# Patient Record
Sex: Female | Born: 1980 | Hispanic: No | Marital: Married | Smoking: Never smoker
Health system: Southern US, Community
[De-identification: ages and names within clinical notes are randomized; demographics above are authoritative.]

## PROBLEM LIST (undated history)

## (undated) DIAGNOSIS — D649 Anemia, unspecified: Secondary | ICD-10-CM

## (undated) HISTORY — DX: Anemia, unspecified: D64.9

---

## 2010-09-26 ENCOUNTER — Inpatient Hospital Stay (HOSPITAL_COMMUNITY): Admission: AD | Admit: 2010-09-26 | Payer: Self-pay | Admitting: Obstetrics

## 2011-01-25 ENCOUNTER — Institutional Professional Consult (permissible substitution): Payer: BC Managed Care – PPO | Admitting: Pediatrics

## 2011-02-04 ENCOUNTER — Inpatient Hospital Stay (HOSPITAL_COMMUNITY)
Admission: AD | Admit: 2011-02-04 | Discharge: 2011-02-06 | DRG: 373 | Disposition: A | Discharge status: Home / Self Care | Payer: BC Managed Care – PPO | Source: Ambulatory Visit | Attending: Obstetrics | Admitting: Obstetrics

## 2011-02-04 LAB — CBC
Hemoglobin: 11.5 g/dL — ABNORMAL LOW (ref 12.0–15.0)
Platelets: 154 10*3/uL (ref 150–400)
RBC: 4.04 MIL/uL (ref 3.87–5.11)
WBC: 11.6 10*3/uL — ABNORMAL HIGH (ref 4.0–10.5)

## 2011-02-04 LAB — RPR: RPR Ser Ql: NONREACTIVE

## 2011-02-04 LAB — ABO/RH: ABO/RH(D): A POS

## 2011-02-04 LAB — TYPE AND SCREEN
ABO/RH(D): A POS
Antibody Screen: NEGATIVE

## 2011-02-05 LAB — CBC
Hemoglobin: 10.1 g/dL — ABNORMAL LOW (ref 12.0–15.0)
MCH: 28 pg (ref 26.0–34.0)
MCHC: 32.2 g/dL (ref 30.0–36.0)
RDW: 14.5 % (ref 11.5–15.5)

## 2015-09-28 NOTE — L&D Delivery Note (Signed)
Delivery Note At 9.44 am a viable and healthy female was delivered via  (Presentation: OA  ).  APGAR: 8 and 9 ; weight -pending   Placenta status:spontaneous, complete.   Cord: loose nuchal cord, reduced at head delivery.  Anesthesia:  Nitrous oxide inhalation and 1% plain Xylocaine infiltration at perineum  Episiotomy:  None  Lacerations:  Right peri-urethral laceration Suture Repair: 3.0 vicryl rapide Est. Blood Loss (mL):  250 cc  Mom to postpartum.  Baby to Couplet care / Skin to Skin.  Shemekia Patane R 04/26/2016, 10:24 AM

## 2015-09-30 LAB — OB RESULTS CONSOLE GC/CHLAMYDIA
CHLAMYDIA, DNA PROBE: NEGATIVE
GC PROBE AMP, GENITAL: NEGATIVE

## 2015-09-30 LAB — OB RESULTS CONSOLE HEPATITIS B SURFACE ANTIGEN: Hepatitis B Surface Ag: NEGATIVE

## 2015-09-30 LAB — OB RESULTS CONSOLE ABO/RH: RH Type: POSITIVE

## 2015-09-30 LAB — OB RESULTS CONSOLE RUBELLA ANTIBODY, IGM: Rubella: IMMUNE

## 2015-09-30 LAB — OB RESULTS CONSOLE HIV ANTIBODY (ROUTINE TESTING): HIV: NONREACTIVE

## 2015-09-30 LAB — OB RESULTS CONSOLE RPR: RPR: NONREACTIVE

## 2015-09-30 LAB — OB RESULTS CONSOLE ANTIBODY SCREEN: Antibody Screen: NEGATIVE

## 2016-03-31 LAB — OB RESULTS CONSOLE GBS: STREP GROUP B AG: NEGATIVE

## 2016-04-22 ENCOUNTER — Telehealth (HOSPITAL_COMMUNITY): Payer: Self-pay | Admitting: *Deleted

## 2016-04-22 ENCOUNTER — Encounter (HOSPITAL_COMMUNITY): Payer: Self-pay | Admitting: *Deleted

## 2016-04-22 NOTE — Telephone Encounter (Signed)
Preadmission screen  

## 2016-04-26 ENCOUNTER — Encounter (HOSPITAL_COMMUNITY): Payer: Self-pay | Admitting: *Deleted

## 2016-04-26 ENCOUNTER — Inpatient Hospital Stay (HOSPITAL_COMMUNITY)
Admission: AD | Admit: 2016-04-26 | Discharge: 2016-04-27 | DRG: 775 | Disposition: A | Discharge status: Home / Self Care | Payer: BC Managed Care – PPO | Source: Ambulatory Visit | Attending: Obstetrics & Gynecology | Admitting: Obstetrics & Gynecology

## 2016-04-26 DIAGNOSIS — Z809 Family history of malignant neoplasm, unspecified: Secondary | ICD-10-CM

## 2016-04-26 DIAGNOSIS — Z3A40 40 weeks gestation of pregnancy: Secondary | ICD-10-CM

## 2016-04-26 DIAGNOSIS — IMO0001 Reserved for inherently not codable concepts without codable children: Secondary | ICD-10-CM

## 2016-04-26 DIAGNOSIS — Z8249 Family history of ischemic heart disease and other diseases of the circulatory system: Secondary | ICD-10-CM | POA: Diagnosis present

## 2016-04-26 LAB — CBC
HCT: 34.6 % — ABNORMAL LOW (ref 36.0–46.0)
HEMOGLOBIN: 12.2 g/dL (ref 12.0–15.0)
MCH: 29.3 pg (ref 26.0–34.0)
MCHC: 35.3 g/dL (ref 30.0–36.0)
MCV: 83 fL (ref 78.0–100.0)
Platelets: 184 10*3/uL (ref 150–400)
RBC: 4.17 MIL/uL (ref 3.87–5.11)
RDW: 14.2 % (ref 11.5–15.5)
WBC: 14.6 10*3/uL — ABNORMAL HIGH (ref 4.0–10.5)

## 2016-04-26 LAB — TYPE AND SCREEN
ABO/RH(D): A POS
ANTIBODY SCREEN: NEGATIVE

## 2016-04-26 MED ORDER — FLEET ENEMA 7-19 GM/118ML RE ENEM: 1.0000 | ENEMA | RECTAL | Status: DC | PRN

## 2016-04-26 MED ORDER — OXYTOCIN 40 UNITS IN LACTATED RINGERS INFUSION - SIMPLE MED
2.5000 [IU]/h | INTRAVENOUS | Status: DC
  Filled 2016-04-26: qty 1000

## 2016-04-26 MED ORDER — PRENATAL MULTIVITAMIN CH
1.0000 | ORAL_TABLET | Freq: Every day | ORAL | Status: DC
  Administered 2016-04-27: 1 via ORAL
  Filled 2016-04-26: qty 1

## 2016-04-26 MED ORDER — ONDANSETRON HCL 4 MG PO TABS: 4.0000 mg | ORAL_TABLET | ORAL | Status: DC | PRN

## 2016-04-26 MED ORDER — ZOLPIDEM TARTRATE 5 MG PO TABS: 5.0000 mg | ORAL_TABLET | Freq: Every evening | ORAL | Status: DC | PRN

## 2016-04-26 MED ORDER — LACTATED RINGERS IV SOLN: 500.0000 mL | INTRAVENOUS | Status: DC | PRN

## 2016-04-26 MED ORDER — BENZOCAINE-MENTHOL 20-0.5 % EX AERO
1.0000 "application " | INHALATION_SPRAY | CUTANEOUS | Status: DC | PRN
  Administered 2016-04-26: 1 via TOPICAL
  Filled 2016-04-26: qty 56

## 2016-04-26 MED ORDER — OXYTOCIN BOLUS FROM INFUSION
500.0000 mL | Freq: Once | INTRAVENOUS | Status: AC
  Administered 2016-04-26: 500 mL via INTRAVENOUS

## 2016-04-26 MED ORDER — OXYCODONE-ACETAMINOPHEN 5-325 MG PO TABS: 2.0000 | ORAL_TABLET | ORAL | Status: DC | PRN

## 2016-04-26 MED ORDER — ONDANSETRON HCL 4 MG/2ML IJ SOLN: 4.0000 mg | Freq: Four times a day (QID) | INTRAMUSCULAR | Status: DC | PRN

## 2016-04-26 MED ORDER — DIPHENHYDRAMINE HCL 25 MG PO CAPS: 25.0000 mg | ORAL_CAPSULE | Freq: Four times a day (QID) | ORAL | Status: DC | PRN

## 2016-04-26 MED ORDER — IBUPROFEN 600 MG PO TABS
600.0000 mg | ORAL_TABLET | Freq: Four times a day (QID) | ORAL | Status: DC
  Administered 2016-04-26 – 2016-04-27 (×4): 600 mg via ORAL
  Filled 2016-04-26 (×5): qty 1

## 2016-04-26 MED ORDER — FENTANYL CITRATE (PF) 100 MCG/2ML IJ SOLN
INTRAMUSCULAR | Status: AC
  Filled 2016-04-26: qty 2

## 2016-04-26 MED ORDER — SIMETHICONE 80 MG PO CHEW: 80.0000 mg | CHEWABLE_TABLET | ORAL | Status: DC | PRN

## 2016-04-26 MED ORDER — FENTANYL CITRATE (PF) 100 MCG/2ML IJ SOLN
100.0000 ug | Freq: Once | INTRAMUSCULAR | Status: AC
  Administered 2016-04-26: 100 ug via INTRAVENOUS

## 2016-04-26 MED ORDER — ACETAMINOPHEN 325 MG PO TABS: 650.0000 mg | ORAL_TABLET | ORAL | Status: DC | PRN

## 2016-04-26 MED ORDER — PHENYLEPHRINE 40 MCG/ML (10ML) SYRINGE FOR IV PUSH (FOR BLOOD PRESSURE SUPPORT)
PREFILLED_SYRINGE | INTRAVENOUS | Status: AC
  Filled 2016-04-26: qty 20

## 2016-04-26 MED ORDER — DIBUCAINE 1 % RE OINT: 1.0000 "application " | TOPICAL_OINTMENT | RECTAL | Status: DC | PRN

## 2016-04-26 MED ORDER — LIDOCAINE HCL (PF) 1 % IJ SOLN
30.0000 mL | INTRAMUSCULAR | Status: DC | PRN
  Administered 2016-04-26: 30 mL via SUBCUTANEOUS
  Filled 2016-04-26: qty 30

## 2016-04-26 MED ORDER — OXYCODONE-ACETAMINOPHEN 5-325 MG PO TABS: 1.0000 | ORAL_TABLET | ORAL | Status: DC | PRN

## 2016-04-26 MED ORDER — TETANUS-DIPHTH-ACELL PERTUSSIS 5-2.5-18.5 LF-MCG/0.5 IM SUSP: 0.5000 mL | Freq: Once | INTRAMUSCULAR | Status: DC

## 2016-04-26 MED ORDER — COCONUT OIL OIL
1.0000 "application " | TOPICAL_OIL | Status: DC | PRN
  Filled 2016-04-26: qty 120

## 2016-04-26 MED ORDER — SOD CITRATE-CITRIC ACID 500-334 MG/5ML PO SOLN: 30.0000 mL | ORAL | Status: DC | PRN

## 2016-04-26 MED ORDER — ONDANSETRON HCL 4 MG/2ML IJ SOLN: 4.0000 mg | INTRAMUSCULAR | Status: DC | PRN

## 2016-04-26 MED ORDER — FENTANYL CITRATE (PF) 100 MCG/2ML IJ SOLN: 100.0000 ug | Freq: Once | INTRAMUSCULAR | Status: DC

## 2016-04-26 MED ORDER — FENTANYL 2.5 MCG/ML BUPIVACAINE 1/10 % EPIDURAL INFUSION (WH - ANES)
INTRAMUSCULAR | Status: AC
  Filled 2016-04-26: qty 125

## 2016-04-26 MED ORDER — SENNOSIDES-DOCUSATE SODIUM 8.6-50 MG PO TABS
2.0000 | ORAL_TABLET | ORAL | Status: DC
  Administered 2016-04-27: 2 via ORAL
  Filled 2016-04-26: qty 2

## 2016-04-26 MED ORDER — WITCH HAZEL-GLYCERIN EX PADS
1.0000 "application " | MEDICATED_PAD | CUTANEOUS | Status: DC | PRN
  Administered 2016-04-26: 1 via TOPICAL

## 2016-04-26 MED ORDER — LACTATED RINGERS IV SOLN
INTRAVENOUS | Status: DC
Start: 2016-04-26 — End: 2016-04-26
  Administered 2016-04-26: 09:00:00 via INTRAVENOUS

## 2016-04-26 NOTE — MAU Note (Signed)
Pt in wc.  Assisted to rm. States ctx's started at 0550, now every 2-3 min. No bleeding or leaking. No problems with preg. Was 2 cm when last checked. 2nd baby.

## 2016-04-26 NOTE — H&P (Signed)
Maria Park is a 35 y.o. female presenting in active labor. 40.5 wks. G2P1001, priro SVD (7 lbs). Uncomplicated pregnancy. Anemia, on iron. GBS(-).   OB History    Gravida Para Term Preterm AB Living   2 1           SAB TAB Ectopic Multiple Live Births                 Past Medical History:  Diagnosis Date  . Anemia   . Postpartum care following vaginal delivery (7/31) 04/26/2016   History reviewed. No pertinent surgical history. Family History: family history includes Cancer in her mother and paternal grandfather; Hypertension in her father; Rheum arthritis in her father. Social History:  reports that she has never smoked. She has never used smokeless tobacco. She reports that she does not drink alcohol or use drugs.     Maternal Diabetes: No Genetic Screening: Normal AFP4 negative Maternal Ultrasounds/Referrals: Normal Fetal Ultrasounds or other Referrals:  None Maternal Substance Abuse:  No Significant Maternal Medications:  None Significant Maternal Lab Results:  Lab values include: Group B Strep negative Other Comments:  None  ROS neg  History Exam Physical Exam  BP 155/75   Pulse 84   Ht 5\' 2"  (1.575 m)   Wt 205 lb (93 kg)   BMI 37.49 kg/m   A&O x 3, no acute distress. Pleasant HEENT neg, no thyromegaly Lungs CTA bilat CV RRR, S1S2 normal Abdo soft, non tender, non acute Extr no edema/ tenderness Pelvic 5-6/ 100%/-1 intact per MAU RN FHT 130s category I Toco regular q 3 minutes  Prenatal labs: ABO, Rh: --/--/A POS (07/31 6440) Antibody: PENDING (07/31 3474) Rubella: Immune (01/03 0000) RPR: Nonreactive (01/03 0000)  HBsAg: Negative (01/03 0000)  HIV: Non-reactive (01/03 0000)  GBS: Negative (07/05 0000)  Glucola normal Anemia  Assessment/Plan: 35 yo G2P1001, active labor at 40.5 wks. GBS(-). EFW 8 lbs. Prior 7lbs SVD. Anticipate SVD. Epidural. Routine labor orders    Maria Park R 04/26/2016 8.40 am

## 2016-04-26 NOTE — Progress Notes (Signed)
Patient voided small amount and stated that she felt her "bladder was empty". Bladder scan showed 222 ml urine residual. Fundus firm, midline and u/1 with small amount of bleeding. Encouraged patient to drink and will measure next output.

## 2016-04-26 NOTE — Progress Notes (Signed)
Patient voided 100 ml urine with difficulty starting stream. Bladder scan showed >200 ml. I/O cath placed with stitches noted to periurethral area. 250 ml urine retrieved with catheterization and patient tolerated well. Fundus midline, firm, 2 small clots expressed. Patient denied urge to void. Encouraged to drink fluids and continue to measure output.

## 2016-04-26 NOTE — Lactation Note (Signed)
This note was copied from a baby's chart. Lactation Consultation Note  Patient Name: Maria Park [id number] Date: 04/26/2016 Reason for consult: Initial assessment Baby at 6 hr of life. Experienced bf mom reports feedings are going well. She denies breast or nipple pain, voiced no concerns. She bf her oldest child for 20m with no issues. Discussed baby behavior, feeding frequency, baby belly size, voids, wt loss, breast changes, and nipple care. She stated she can manually express and has spoon in room. Given lactation handouts. Aware of OP services and support group. She will call as needed.      Maternal Data Has patient been taught Hand Expression?: Yes Does the patient have breastfeeding experience prior to this delivery?: Yes  Feeding    LATCH Score/Interventions                      Lactation Tools Discussed/Used WIC Program: No   Consult Status Consult Status: Follow-up Date: 04/27/16 Follow-up type: In-patient    Maria Park 04/26/2016, 4:41 PM

## 2016-04-27 LAB — CBC
HCT: 29.7 % — ABNORMAL LOW (ref 36.0–46.0)
Hemoglobin: 10.2 g/dL — ABNORMAL LOW (ref 12.0–15.0)
MCH: 28.9 pg (ref 26.0–34.0)
MCHC: 34.3 g/dL (ref 30.0–36.0)
MCV: 84.1 fL (ref 78.0–100.0)
PLATELETS: 182 10*3/uL (ref 150–400)
RBC: 3.53 MIL/uL — ABNORMAL LOW (ref 3.87–5.11)
RDW: 14.7 % (ref 11.5–15.5)
WBC: 13.5 10*3/uL — ABNORMAL HIGH (ref 4.0–10.5)

## 2016-04-27 LAB — RPR: RPR Ser Ql: NONREACTIVE

## 2016-04-27 MED ORDER — COCONUT OIL OIL: 1.0000 "application " | TOPICAL_OIL | 0 refills | Status: DC | PRN

## 2016-04-27 MED ORDER — IBUPROFEN 600 MG PO TABS: 600.0000 mg | ORAL_TABLET | Freq: Four times a day (QID) | ORAL | 0 refills | Status: DC

## 2016-04-27 MED ORDER — ACETAMINOPHEN 325 MG PO TABS: 650.0000 mg | ORAL_TABLET | ORAL | Status: DC | PRN

## 2016-04-27 NOTE — Discharge Summary (Signed)
Obstetric Discharge Summary Reason for Admission: onset of labor Prenatal Procedures: ultrasound Intrapartum Procedures: spontaneous vaginal delivery Postpartum Procedures: none Complications-Operative and Postpartum: periurethral laceration repaired Hemoglobin  Date Value Ref Range Status  04/27/2016 10.2 (L) 12.0 - 15.0 g/dL Final   HCT  Date Value Ref Range Status  04/27/2016 29.7 (L) 36.0 - 46.0 % Final    Physical Exam:  General: alert, cooperative and no distress Lochia: appropriate Uterine Fundus: firm Incision: healing well DVT Evaluation: No evidence of DVT seen on physical exam. No significant calf/ankle edema.  Discharge Diagnoses: Term Pregnancy-delivered  Discharge Information: Date: 04/27/2016 Activity: pelvic rest Diet: routine Medications: PNV and Ibuprofen Condition: stable Instructions: refer to practice specific booklet Discharge to: home Follow-up Information    [facility] A., MD. Schedule an appointment as soon as possible for a visit in 6 week(s).   Specialty:  Obstetrics and Gynecology Why:  Postpartum visit Contact information: [address] [place] [address] [phone]           Newborn Data: Live born female "[place] Mia" Birth Weight: 8 lb 6.8 oz (3820 g) APGAR: 8, 9  Home with mother.  Neta Mends, CNM  04/27/2016, 9:11 AM

## 2016-04-27 NOTE — Discharge Instructions (Signed)
Breast Pumping Tips °If you are breastfeeding, there may be times when you cannot feed your baby directly. Returning to work or going on a trip are common examples. Pumping allows you to store breast milk and feed it to your baby later.  °You may not get much milk when you first start to pump. Your breasts should start to make more after a few days. If you pump at the times you usually feed your baby, you may be able to keep making enough milk to feed your baby without also using formula. The more often you pump, the more milk you will produce.  °WHEN SHOULD I PUMP?  °· You can begin to pump soon after delivery. However, some experts recommend waiting about 4 weeks before giving your infant a bottle to make sure breastfeeding is going well.  °· If you plan to return to work, begin pumping a few weeks before. This will help you develop techniques that work best for you. It also lets you build up a supply of breast milk.   °· When you are with your infant, feed on demand and pump after each feeding.   °· When you are away from your infant for several hours, pump for about 15 minutes every 2-3 hours. Pump both breasts at the same time if you can.   °· If your infant has a formula feeding, make sure to pump around the same time.     °· If you drink any alcohol, wait 2 hours before pumping.   °HOW DO I PREPARE TO PUMP? °Your let-down reflex is the natural reaction to stimulation that makes your breast milk flow. It is easier to stimulate this reflex when you are relaxed. Find relaxation techniques that work for you. If you have difficulty with your let-down reflex, try these methods:  °· Smell one of your infant's blankets or an item of clothing.   °· Look at a picture or video of your infant.   °· Sit in a quiet, private space.   °· Massage the breast you plan to pump.   °· Place soothing warmth on the breast.   °· Play relaxing music.   °WHAT ARE SOME GENERAL BREAST PUMPING TIPS? °· Wash your hands before you pump. You  do not need to wash your nipples or breasts. °· There are three ways to pump. °¨ You can use your hand to massage and compress your breast. °¨ You can use a handheld manual pump. °¨ You can use an electric pump.   °· Make sure the suction cup (flange) on the breast pump is the right size. Place the flange directly over the nipple. If it is the wrong size or placed the wrong way, it may be painful and cause nipple damage.   °· If pumping is uncomfortable, apply a small amount of purified or modified lanolin to your nipple and areola. °· If you are using an electric pump, adjust the speed and suction power to be more comfortable. °· If pumping is painful or if you are not getting very much milk, you may need a different type of pump. A lactation consultant can help you determine what type of pump to use.   °· Keep a full water bottle near you at all times. Drinking lots of fluid helps you make more milk.  °· You can store your milk to use later. Pumped breast milk can be stored in a sealable, sterile container or plastic bag. Label all stored breast milk with the date you pumped it. °¨ Milk can stay out at room temperature for up to 8 hours. °¨   You can store your milk in the refrigerator for up to 8 days. °¨ You can store your milk in the freezer for 3 months. Thaw frozen milk using warm water. Do not put it in the microwave. °· Do not smoke. Smoking can lower your milk supply and harm your infant. If you need help quitting, ask your health care provider to recommend a program.   °WHEN SHOULD I CALL MY HEALTH CARE PROVIDER OR A LACTATION CONSULTANT? °· You are having trouble pumping. °· You are concerned that you are not making enough milk. °· You have nipple pain, soreness, or redness. °· You want to use birth control. Birth control pills may lower your milk supply. Talk to your health care provider about your options. °  °This information is not intended to replace advice given to you by your health care provider.  Make sure you discuss any questions you have with your health care provider. °  °Document Released: 03/03/2010 Document Revised: 09/18/2013 Document Reviewed: 07/06/2013 °Elsevier Interactive Patient Education ©2016 [employer] Inc[employer] ° °

## 2016-04-27 NOTE — Progress Notes (Signed)
Patient ID: Maria Park, female   DOB: 1981/04/05, 35 y.o.   MRN: [id number] PPD 1 SVD  S:  Reports feeling well, desires DC home this AM.             Tolerating po/ No nausea or vomiting             Bleeding is light             Pain controlled with PO meds             Up ad lib / ambulatory / voiding freely.   Newborn  Information for the patient's newborn:  [name], [name] [[id number]]  female  breast feeding with some difficulty, + nipple pain and scabbing  "[place] MIA"  O:  A & O x 3 NAD             VS:  Vitals:   04/26/16 1243 04/26/16 1650 04/26/16 1931 04/27/16 0639  BP: 131/78 133/67 119/68 128/83  Pulse: 77 80 87 81  Resp: 18 20  20   Temp: 97.3 F (36.3 C)  99.2 F (37.3 C) 98.2 F (36.8 C)  TempSrc: Oral   Oral  SpO2:      Weight:      Height:        LABS:  Recent Labs  04/26/16 0905 04/27/16 0515  WBC 14.6* 13.5*  HGB 12.2 10.2*  HCT 34.6* 29.7*  PLT 184 182    Blood type: --/--/A POS (07/31 0905)  Rubella: Immune (01/03 0000)   I&O: I/O last 3 completed shifts: In: -  Out: 725 [Urine:350; Blood:375]   No intake/output data recorded.  Breasts: Soft, bilat excoriations on nipples  Abdomen: soft, non-tender, non-distended              Fundus: firm, non-tender, U -1  Perineum: no edema  Lochia: small  Extremities: trace edema, no calf pain or tenderness    A/P: PPD # 1 35 y.o., G2P2    Principal Problem:   Postpartum care following vaginal delivery (7/31) Active Problems:   SVD (spontaneous vaginal delivery)   Doing well - stable status  Routine post partum orders  [facility] consult for latch done, patient notes improvement with latch afterwards  DC home with WOB instructions booklet.   Neta Mends, CNM 04/27/2016, 9:05 AM

## 2016-04-27 NOTE — Lactation Note (Signed)
This note was copied from a baby's chart. Lactation Consultation Note  Patient Name: Maria Park [id number] Date: 04/27/2016 Reason for consult: Follow-up assessment Baby is 70 hours old and for early D/C today . Per parents will return to [facility] MD tomorrow for  Weight check. Per mom having soreness and LC on 11-7 gave her #20 NS, and she also has #24 NS  At home if she needs it. Also has a DEBP for post pumping. LC reviewed basics , and stressed the importance  Of skin to skin feedings  Until the baby [place] gaining well. If using a NS for latching, the need for post pumping at least  After 4 feedings a day , but until milk comes in after 5-6 feedings a day for 10 -15 mins both breast. Sore nipple and engorgement prevention and tx reviewed.  Mother informed of post-discharge support and given phone number to the lactation department, including services for  phone call assistance; out-patient appointments; and breastfeeding support group. List of other breastfeeding resources  in the community given in the handout. Encouraged mother to call for problems or concerns related to breastfeeding. Mom and dad agreeable to come back for [facility]P appt. Next week 8/10 at 1 pm, appt. Reminder given to mom.   Maternal Data    Feeding Length of feed: 5 min  LATCH Score/Interventions ( this Latch score was done by [employer] RN  Latch: Grasps breast easily, tongue down, lips flanged, rhythmical sucking. Intervention(s): Adjust position;Assist with latch  Audible Swallowing: Spontaneous and intermittent Intervention(s): Skin to skin  Type of Nipple: Flat  Comfort (Breast/Nipple): Filling, red/small blisters or bruises, mild/mod discomfort Problem noted: Cracked, bleeding, blisters, bruises     Hold (Positioning): Assistance needed to correctly position infant at breast and maintain latch.  LATCH Score: 7  Lactation Tools Discussed/Used Tools: Nipple Shields Nipple shield size: 20   Consult  Status Consult Status: Follow-up Date: 05/06/16 Follow-up type: Out-patient    Kathrin Greathouse 04/27/2016, 11:58 AM

## 2016-04-27 NOTE — Lactation Note (Signed)
This note was copied from a baby's chart. Lactation Consultation Note Mom c/o painful latches and nipple pain. Rt. Nipple nipple RED raspberry raw appearance d/t BF painful latching. Lt. Nipple has raw area top of nipple. Mom stated she had to wear a NS with her daughter whom she BF for 6 months. Requesting NS. Fitted #20 NS.  Shells given to wear in bra. Moms nipples everted. Lt. Nipple more shorter shaft than Rt. Hand expression w/colostrum noted. Baby has a small mouth w/a strong suck and clamping down on gloved finger. Protrudes tongue out side of mouth, has slight heart shape occasionally. Good lateralization of tongue. BF in football position, painful at first latching, then subsides until baby stops suckling, then starts again. Every time baby restarts again it is painful for a few sucks then eases. No transfer of colostrum noted at this time after 5 min. Of BF. Encouraged mom to look for transfer in NS. Comfort gels given.  Patient Name: Maria Park [id number] Date: 04/27/2016 Reason for consult: Follow-up assessment;Breast/nipple pain   Maternal Data    Feeding Feeding Type: Breast Fed Length of feed: 20 min  LATCH Score/Interventions Latch: Repeated attempts needed to sustain latch, nipple held in mouth throughout feeding, stimulation needed to elicit sucking reflex. Intervention(s): Skin to skin;Teach feeding cues;Waking techniques Intervention(s): Adjust position;Assist with latch;Breast massage;Breast compression  Audible Swallowing: None Intervention(s): Hand expression;Skin to skin Intervention(s): Alternate breast massage  Type of Nipple: Everted at rest and after stimulation Intervention(s): Shells;Hand pump  Comfort (Breast/Nipple): Engorged, cracked, bleeding, large blisters, severe discomfort Problem noted: Cracked, bleeding, blisters, bruises Intervention(s): Hand pump;Expressed breast milk to nipple  Problem noted: Mild/Moderate discomfort Interventions  (Mild/moderate discomfort): Comfort gels;Breast shields;Hand massage;Hand expression  Hold (Positioning): Assistance needed to correctly position infant at breast and maintain latch. Intervention(s): Breastfeeding basics reviewed;Support Pillows;Position options;Skin to skin  LATCH Score: 4  Lactation Tools Discussed/Used Tools: Shells;Nipple Shields;Pump;Comfort gels Nipple shield size: 20 Shell Type: Inverted Breast pump type: Manual Pump Review: Setup, frequency, and cleaning;Milk Storage Initiated by:: RN Date initiated:: 04/26/16   Consult Status Consult Status: Follow-up Date: 04/27/16 (in pm) Follow-up type: In-patient    Maria Park, [name] 04/27/2016, 2:59 AM

## 2016-04-28 ENCOUNTER — Inpatient Hospital Stay (HOSPITAL_COMMUNITY): Admission: RE | Admit: 2016-04-28 | Payer: BC Managed Care – PPO | Source: Ambulatory Visit

## 2016-05-06 ENCOUNTER — Ambulatory Visit (HOSPITAL_COMMUNITY)
Admit: 2016-05-06 | Discharge: 2016-05-06 | Disposition: A | Discharge status: Home / Self Care | Payer: BC Managed Care – PPO | Attending: Obstetrics | Admitting: Obstetrics

## 2016-05-06 NOTE — Lactation Note (Signed)
Lactation Consult  Mother's reason for visit:  Follow-up for NS Visit Type:  OP Appointment Notes: Baby has posterior tightness and central tongue retraction with extension she does not open wide or flange her lips well. Mom's left breast is reddened 2-3 cm beyond the areola around the circumference. It is firm and warm to the touch. She does not have a fever and her OB is aware of situation.  Therapeutic breast massage of lactation was performed and it started to soften. Encouraged mom to do this at home. Maria Park attached to that breast using a #24 NS and with breast compressions many swallows were heard but she only transferred 8 ml. Attached to the right breast without the NS and with good positioning pain was absent. Transfer was 34. Re-attached to the left breast in a FB hold.  She fed but then detached herself and reattached with a MUCH wider gape. Improvement in her jaw motion followed.  She transferred an additional 28 ml from the first side for a total of 70 ml.    Plan BF on cue off-center latch , teacup hold,flange lips,breast compression to aid with transfer Laid back BF SUkcing exercises Support group Consult:  Initial Lactation Consultant:  Soyla DryerJoseph, Blanca Carreon  ________________________________________________________________________ Joan FloresBaby's Name:  Maria Park Mia Esguerra Date of Birth:  04/26/2015 Pediatrician:  [facility] Gender:  female Gestational Age: 954w5d (At Birth) Birth Weight:  8 lb 6.8 oz (3820 g) Weight at Discharge:  Weight: 8 lb 4.8 oz (3765 g)                                   Date of Discharge:  04/27/2016 [facility]   04/26/16 0944 04/27/16 0002  Weight: 8 lb 6.8 oz (3820 g) 8 lb 4.8 oz (3765 g)  [facility]  9#0 05/04/2016    Location:Pediatrician's office Weight today:  9#1.8    ________________________________________________________________________  Mother's Name: Valerie SaltsJane Gruen

## 2017-01-13 ENCOUNTER — Encounter (HOSPITAL_COMMUNITY): Payer: Self-pay | Admitting: *Deleted

## 2017-01-13 ENCOUNTER — Emergency Department (HOSPITAL_COMMUNITY): Payer: BC Managed Care – PPO

## 2017-01-13 ENCOUNTER — Emergency Department (HOSPITAL_COMMUNITY)
Admission: EM | Admit: 2017-01-13 | Discharge: 2017-01-13 | Disposition: A | Discharge status: Home / Self Care | Payer: BC Managed Care – PPO | Attending: Emergency Medicine | Admitting: Emergency Medicine

## 2017-01-13 DIAGNOSIS — S81012A Laceration without foreign body, left knee, initial encounter: Secondary | ICD-10-CM | POA: Diagnosis not present

## 2017-01-13 DIAGNOSIS — M791 Myalgia, unspecified site: Secondary | ICD-10-CM

## 2017-01-13 DIAGNOSIS — Y999 Unspecified external cause status: Secondary | ICD-10-CM | POA: Insufficient documentation

## 2017-01-13 DIAGNOSIS — Y92410 Unspecified street and highway as the place of occurrence of the external cause: Secondary | ICD-10-CM | POA: Insufficient documentation

## 2017-01-13 DIAGNOSIS — S81011A Laceration without foreign body, right knee, initial encounter: Secondary | ICD-10-CM | POA: Insufficient documentation

## 2017-01-13 DIAGNOSIS — Y939 Activity, unspecified: Secondary | ICD-10-CM | POA: Insufficient documentation

## 2017-01-13 DIAGNOSIS — S20312A Abrasion of left front wall of thorax, initial encounter: Secondary | ICD-10-CM | POA: Diagnosis not present

## 2017-01-13 DIAGNOSIS — M25561 Pain in right knee: Secondary | ICD-10-CM

## 2017-01-13 DIAGNOSIS — M25562 Pain in left knee: Secondary | ICD-10-CM

## 2017-01-13 DIAGNOSIS — S8991XA Unspecified injury of right lower leg, initial encounter: Secondary | ICD-10-CM | POA: Diagnosis present

## 2017-01-13 DIAGNOSIS — T148XXA Other injury of unspecified body region, initial encounter: Secondary | ICD-10-CM

## 2017-01-13 MED ORDER — IBUPROFEN 400 MG PO TABS
800.0000 mg | ORAL_TABLET | Freq: Once | ORAL | Status: AC
  Administered 2017-01-13: 800 mg via ORAL
  Filled 2017-01-13: qty 2

## 2017-01-13 MED ORDER — IBUPROFEN 600 MG PO TABS: 600.0000 mg | ORAL_TABLET | Freq: Four times a day (QID) | ORAL | 0 refills | Status: DC | PRN

## 2017-01-13 MED ORDER — METHOCARBAMOL 500 MG PO TABS: 500.0000 mg | ORAL_TABLET | Freq: Two times a day (BID) | ORAL | 0 refills | Status: DC | PRN
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
## 2017-01-13 NOTE — ED Notes (Signed)
Patient transported to X-ray 
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
## 2019-11-22 ENCOUNTER — Ambulatory Visit: Payer: BC Managed Care – PPO | Attending: Family

## 2019-11-22 DIAGNOSIS — Z23 Encounter for immunization: Secondary | ICD-10-CM | POA: Insufficient documentation

## 2019-11-22 NOTE — Progress Notes (Signed)
   Covid-19 Vaccination Clinic  Name:  Maria Park    MRN: [id number] DOB: 28-Jan-1981  11/22/2019  Maria Park was observed post Covid-19 immunization for 15 minutes without incidence. She was provided with Vaccine Information Sheet and instruction to access the V-Safe system.   Maria Park was instructed to call 911 with any severe reactions post vaccine: Marland Kitchen Difficulty breathing  . Swelling of your face and throat  . A fast heartbeat  . A bad rash all over your body  . Dizziness and weakness    Immunizations Administered    Name Date Dose VIS Date Route   Moderna COVID-19 Vaccine 11/22/2019  3:28 PM 0.5 mL 08/28/2019 Intramuscular   Manufacturer: Moderna   Lot: [id number]   NDC: 36644-034-74

## 2019-12-25 ENCOUNTER — Ambulatory Visit: Payer: BC Managed Care – PPO | Attending: Family

## 2019-12-25 DIAGNOSIS — Z23 Encounter for immunization: Secondary | ICD-10-CM

## 2019-12-25 NOTE — Progress Notes (Signed)
   Covid-19 Vaccination Clinic  Name:  Maria Park    MRN: [id number] DOB: June 04, 1981  12/25/2019  [name] was observed post Covid-19 immunization for 15 minutes without incident. She was provided with Vaccine Information Sheet and instruction to access the V-Safe system.   [name] was instructed to call 911 with any severe reactions post vaccine: Marland Kitchen Difficulty breathing  . Swelling of face and throat  . A fast heartbeat  . A bad rash all over body  . Dizziness and weakness   Immunizations Administered    Name Date Dose VIS Date Route   Moderna COVID-19 Vaccine 12/25/2019  3:39 PM 0.5 mL 08/28/2019 Intramuscular   Manufacturer: Moderna   Lot: [id number]   NDC: 30865-784-69

## 2020-10-07 ENCOUNTER — Ambulatory Visit: Payer: Self-pay

## 2020-10-07 ENCOUNTER — Other Ambulatory Visit: Payer: Self-pay

## 2020-10-07 DIAGNOSIS — Z20822 Contact with and (suspected) exposure to covid-19: Secondary | ICD-10-CM

## 2020-10-11 LAB — SPECIMEN STATUS REPORT

## 2020-10-11 LAB — NOVEL CORONAVIRUS, NAA: SARS-CoV-2, NAA: NOT DETECTED

## 2023-01-11 ENCOUNTER — Ambulatory Visit (INDEPENDENT_AMBULATORY_CARE_PROVIDER_SITE_OTHER): Payer: BC Managed Care – PPO | Admitting: Family Medicine

## 2023-01-11 ENCOUNTER — Encounter (INDEPENDENT_AMBULATORY_CARE_PROVIDER_SITE_OTHER): Payer: Self-pay | Admitting: Family Medicine

## 2023-01-11 VITALS — BP 120/81 | HR 71 | Temp 97.7°F | Ht 62.0 in | Wt 187.0 lb

## 2023-01-11 DIAGNOSIS — R632 Polyphagia: Secondary | ICD-10-CM | POA: Diagnosis not present

## 2023-01-11 DIAGNOSIS — Z6834 Body mass index (BMI) 34.0-34.9, adult: Secondary | ICD-10-CM

## 2023-01-11 DIAGNOSIS — Z0289 Encounter for other administrative examinations: Secondary | ICD-10-CM

## 2023-01-11 DIAGNOSIS — E669 Obesity, unspecified: Secondary | ICD-10-CM | POA: Diagnosis not present

## 2023-01-11 NOTE — Assessment & Plan Note (Signed)
She has struggled more since juggling work and her family to eat on a schedule and work on meal planning.  Plan:  begin working on eating 3 meals/ day. Increase water intake to >64 oz/ day Will assess metabolic rate next visit

## 2023-01-11 NOTE — Assessment & Plan Note (Signed)
Reviewed program information and current bioimpedence results Reviewed realistic goals and ideas for adding in more physical activity given time constraints.  Plan:  return for fasting labs and IC

## 2023-01-11 NOTE — Progress Notes (Signed)
Office: [phone]  /  Fax: [phone]   Initial Visit  Maria Park was seen in clinic today to evaluate for obesity. She is interested in losing weight to improve overall health and reduce the risk of weight related complications. She presents today to review program treatment options, initial physical assessment, and evaluation.     She was referred by: PCP  When asked what else they would like to accomplish? She states: Adopt healthier eating patterns, Improve energy levels and physical activity, and Improve appearance  Weight history: she has struggled more with her weight after her 2nd baby (age 42).  Lost to 140 lb after having her oldest child (age 16).  Weight has been stable for the past few years  When asked how has your weight affected you? She states: Contributed to orthopedic problems or mobility issues and Having fatigue  Some associated conditions: None  Contributing factors: Family history, Stress, and Reduced physical activity  Weight promoting medications identified: None  has IUD  Current nutrition plan: None  Current level of physical activity: NEAT used to run and do weights; played sports  Current or previous pharmacotherapy: None  Response to medication: Never tried medications   Past medical history includes:   Past Medical History:  Diagnosis Date   Anemia    Postpartum care following vaginal delivery (7/31) 04/26/2016     Objective:   BP 120/81   Pulse 71   Temp 97.7 F (36.5 C)   Ht  (1.575 m)   Wt 187 lb (84.8 kg)   SpO2 99%   BMI 34.20 kg/m  She was weighed on the bioimpedance scale: Body mass index is 34.2 kg/m.  Peak Weight:190 , Body Fat%:39.3, Visceral Fat Rating:9, Weight trend over the last 12 months: Unchanged  General:  Alert, oriented and cooperative. Patient is in no acute distress.  Respiratory: Normal respiratory effort, no problems with respiration noted   Gait: able to ambulate independently  Mental Status:  Normal mood and affect. Normal behavior. Normal judgment and thought content.   DIAGNOSTIC DATA REVIEWED:  BMET No results found for: "NA", "K", "CL", "CO2", "GLUCOSE", "BUN", "CREATININE", "CALCIUM", "GFRNONAA", "GFRAA" No results found for: "HGBA1C" No results found for: "INSULIN" CBC    Component Value Date/Time   WBC 13.5 (H) 04/27/2016 0515   RBC 3.53 (L) 04/27/2016 0515   HGB 10.2 (L) 04/27/2016 0515   HCT 29.7 (L) 04/27/2016 0515   PLT 182 04/27/2016 0515   MCV 84.1 04/27/2016 0515   MCH 28.9 04/27/2016 0515   MCHC 34.3 04/27/2016 0515   RDW 14.7 04/27/2016 0515   Iron/TIBC/Ferritin/ %Sat No results found for: "IRON", "TIBC", "FERRITIN", "IRONPCTSAT" Lipid Panel  No results found for: "CHOL", "TRIG", "HDL", "CHOLHDL", "VLDL", "LDLCALC", "LDLDIRECT" Hepatic Function Panel  No results found for: "PROT", "ALBUMIN", "AST", "ALT", "ALKPHOS", "BILITOT", "BILIDIR", "IBILI" No results found for: "TSH"   Assessment and Plan:   Polyphagia Assessment & Plan: She has struggled more since juggling work and her family to eat on a schedule and work on meal planning.  Plan:  begin working on eating 3 meals/ day. Increase water intake to >64 oz/ day Will assess metabolic rate next visit   Generalized obesity Assessment & Plan: Reviewed program information and current bioimpedence results Reviewed realistic goals and ideas for adding in more physical activity given time constraints.  Plan:  return for fasting labs and IC    BMI 34.0-34.9,adult        Obesity Treatment / Action  Plan:  Patient will work on garnering support from family and friends to begin weight loss journey. Will work on eliminating or reducing the presence of highly palatable, calorie dense foods in the home. Will complete provided nutritional and psychosocial assessment questionnaire before the next appointment. Will be scheduled for indirect calorimetry to determine resting energy expenditure in a  fasting state.  This will allow [place] to create a reduced calorie, high-protein meal plan to promote loss of fat mass while preserving muscle mass. Will think about ideas on how to incorporate physical activity into their daily routine. Will work on managing stress via relaxation methods as this may result in unhealthy eating patterns. Was counseled on nutritional approaches to weight loss and benefits of complex carbs and high quality protein as part of nutritional weight management. Was counseled on pharmacotherapy and role as an adjunct in weight management.   Obesity Education Performed Today:  She was weighed on the bioimpedance scale and results were discussed and documented in the synopsis.  We discussed obesity as a disease and the importance of a more detailed evaluation of all the factors contributing to the disease.  We discussed the importance of long term lifestyle changes which include nutrition, exercise and behavioral modifications as well as the importance of customizing this to her specific health and social needs.  We discussed the benefits of reaching a healthier weight to alleviate the symptoms of existing conditions and reduce the risks of the biomechanical, metabolic and psychological effects of obesity.  Maria Park appears to be in the action stage of change and states they are ready to start intensive lifestyle modifications and behavioral modifications.  30 minutes was spent today on this visit including the above counseling, pre-visit chart review, and post-visit documentation.  Reviewed by clinician on day of visit: allergies, medications, problem list, medical history, surgical history, family history, social history, and previous encounter notes pertinent to obesity diagnosis.    Seymour Bars, D.O. DABFM, DABOM [facility] [phone] [place], [place] [address] [phone]

## 2023-01-25 ENCOUNTER — Encounter (INDEPENDENT_AMBULATORY_CARE_PROVIDER_SITE_OTHER): Payer: Self-pay | Admitting: Family Medicine

## 2023-01-25 ENCOUNTER — Ambulatory Visit (INDEPENDENT_AMBULATORY_CARE_PROVIDER_SITE_OTHER): Payer: BC Managed Care – PPO | Admitting: Family Medicine

## 2023-01-25 VITALS — BP 118/82 | HR 67 | Temp 98.3°F | Ht 62.0 in | Wt 185.0 lb

## 2023-01-25 DIAGNOSIS — R0602 Shortness of breath: Secondary | ICD-10-CM

## 2023-01-25 DIAGNOSIS — R948 Abnormal results of function studies of other organs and systems: Secondary | ICD-10-CM | POA: Diagnosis not present

## 2023-01-25 DIAGNOSIS — R632 Polyphagia: Secondary | ICD-10-CM

## 2023-01-25 DIAGNOSIS — Z1331 Encounter for screening for depression: Secondary | ICD-10-CM

## 2023-01-25 DIAGNOSIS — R5383 Other fatigue: Secondary | ICD-10-CM | POA: Diagnosis not present

## 2023-01-25 DIAGNOSIS — N920 Excessive and frequent menstruation with regular cycle: Secondary | ICD-10-CM | POA: Diagnosis not present

## 2023-01-25 DIAGNOSIS — F32A Depression, unspecified: Secondary | ICD-10-CM

## 2023-01-25 DIAGNOSIS — E669 Obesity, unspecified: Secondary | ICD-10-CM

## 2023-01-25 DIAGNOSIS — Z6833 Body mass index (BMI) 33.0-33.9, adult: Secondary | ICD-10-CM

## 2023-01-26 LAB — COMPREHENSIVE METABOLIC PANEL
ALT: 22 IU/L (ref 0–32)
AST: 21 IU/L (ref 0–40)
Albumin/Globulin Ratio: 1.9 (ref 1.2–2.2)
Albumin: 4.4 g/dL (ref 3.9–4.9)
Alkaline Phosphatase: 91 IU/L (ref 44–121)
BUN/Creatinine Ratio: 16 (ref 9–23)
BUN: 11 mg/dL (ref 6–24)
Bilirubin Total: 0.3 mg/dL (ref 0.0–1.2)
CO2: 22 mmol/L (ref 20–29)
Calcium: 9.1 mg/dL (ref 8.7–10.2)
Chloride: 105 mmol/L (ref 96–106)
Creatinine, Ser: 0.69 mg/dL (ref 0.57–1.00)
Globulin, Total: 2.3 g/dL (ref 1.5–4.5)
Glucose: 76 mg/dL (ref 70–99)
Potassium: 4.5 mmol/L (ref 3.5–5.2)
Sodium: 143 mmol/L (ref 134–144)
Total Protein: 6.7 g/dL (ref 6.0–8.5)
eGFR: 112 mL/min/{1.73_m2} (ref >59)

## 2023-01-26 LAB — CBC
Hematocrit: 39.9 % (ref 34.0–46.6)
Hemoglobin: 13 g/dL (ref 11.1–15.9)
MCH: 29.1 pg (ref 26.6–33.0)
MCHC: 32.6 g/dL (ref 31.5–35.7)
MCV: 90 fL (ref 79–97)
Platelets: 217 10*3/uL (ref 150–450)
RBC: 4.46 x10E6/uL (ref 3.77–5.28)
RDW: 12.6 % (ref 11.7–15.4)
WBC: 5.5 10*3/uL (ref 3.4–10.8)

## 2023-01-26 LAB — LIPID PANEL
Chol/HDL Ratio: 4.2 ratio (ref 0.0–4.4)
Cholesterol, Total: 209 mg/dL — ABNORMAL HIGH (ref 100–199)
HDL: 50 mg/dL (ref >39)
LDL Chol Calc (NIH): 141 mg/dL — ABNORMAL HIGH (ref 0–99)
Triglycerides: 101 mg/dL (ref 0–149)
VLDL Cholesterol Cal: 18 mg/dL (ref 5–40)

## 2023-01-26 LAB — HEMOGLOBIN A1C
Est. average glucose Bld gHb Est-mCnc: 111 mg/dL
Hgb A1c MFr Bld: 5.5 % (ref 4.8–5.6)

## 2023-01-26 LAB — TSH RFX ON ABNORMAL TO FREE T4: TSH: 1.29 u[IU]/mL (ref 0.450–4.500)

## 2023-01-26 LAB — FOLATE: Folate: 9.1 ng/mL (ref >3.0)

## 2023-01-26 LAB — VITAMIN B12: Vitamin B-12: 320 pg/mL (ref 232–1245)

## 2023-01-26 LAB — INSULIN, RANDOM: INSULIN: 10.4 u[IU]/mL (ref 2.6–24.9)

## 2023-01-26 LAB — FERRITIN: Ferritin: 87 ng/mL (ref 15–150)

## 2023-01-26 LAB — VITAMIN D 25 HYDROXY (VIT D DEFICIENCY, FRACTURES): Vit D, 25-Hydroxy: 18.9 ng/mL — ABNORMAL LOW (ref 30.0–100.0)

## 2023-01-26 NOTE — Progress Notes (Signed)
Chief Complaint:   OBESITY Maria Park (MR# [id number]) is a 42 y.o. female who presents for evaluation and treatment of obesity and related comorbidities. Current BMI is Body mass index is 33.84 kg/m. Maverick has been struggling with her weight for many years and has been unsuccessful in either losing weight, maintaining weight loss, or reaching her healthy weight goal.  Kanani works as a Runner, broadcasting/film/video, Geophysicist/field seismologist school.  She would like to lose 25-30 LBS, she eats fast and snacks when she is bored and stressed.  Her husband is supportive.  She has 2 children ages 3 and 49 years old.  She has an IUD for birth control.  She has never been AOM's.  Time is a factor with cooking, and meal prep.  Amenah is currently in the action stage of change and ready to dedicate time achieving and maintaining a healthier weight. Rene is interested in becoming our patient and working on intensive lifestyle modifications including (but not limited to) diet and exercise for weight loss.  Myya's habits were reviewed today and are as follows: Her family eats meals together, she thinks her family will eat healthier with her, her desired weight loss is 35 lbs, she started gaining weight after the birth of her second child, her heaviest weight ever was 190 pounds, she has significant food cravings issues, she snacks frequently in the evenings, she is frequently drinking liquids with calories, she frequently makes poor food choices, she frequently eats larger portions than normal, and she struggles with emotional eating.  Depression Screen Kinnley's Food and Mood (modified PHQ-9) score was 5.  Subjective:   1. Other fatigue Senaida admits to daytime somnolence and admits to waking up still tired. Patient has a history of symptoms of daytime fatigue, morning fatigue, and morning headache. Ieasha generally gets 5 or 6 hours of sleep per night, and states that she has nightime awakenings. Snoring is present. Apneic episodes are not  present. Epworth Sleepiness Score is 8.  EKG, NSR without ischemia.  2. SOBOE (shortness of breath on exertion) Waneda notes increasing shortness of breath with exercising and seems to be worsening over time with weight gain. She notes getting out of breath sooner with activity than she used to. This has not gotten worse recently. Marlen denies shortness of breath at rest or orthopnea.  3. Polyphagia Patient reports long history of eating fast, over eating and eating mindlessly.  She has never been AOM's.  4. Menorrhagia with regular cycle Symptoms have improved with IUD.  History of IDA.  Patient complains of fatigue.  No sign of PICA syndrome.  5. Low basal metabolic rate Patient metabolic rate is 086 cal/day slower than expected, likely due to poor sleep at night and less physical activity.  Assessment/Plan:   1. Other fatigue Desha does feel that her weight is causing her energy to be lower than it should be. Fatigue may be related to obesity, depression or many other causes. Labs will be ordered, and in the meanwhile, Lashonda will focus on self care including making healthy food choices, increasing physical activity and focusing on stress reduction.  Update labs today.  - EKG 12-Lead - VITAMIN D 25 Hydroxy (Vit-D Deficiency, Fractures) - Comprehensive metabolic panel - Lipid panel - Vitamin B12 - CBC - TSH Rfx on Abnormal to Free T4 - Hemoglobin A1c - Folate - Insulin, random - Ferritin  2. SOBOE (shortness of breath on exertion) Joylyn does feel that she gets out of breath more easily  that she used to when she exercises. Klani's shortness of breath appears to be obesity related and exercise induced. She has agreed to work on weight loss and gradually increase exercise to treat her exercise induced shortness of breath. Will continue to monitor closely.  3. Polyphagia Begin prescribed meal plan, eating on a schedule and reducing high sugar items that stimulate appetite.  4. Menorrhagia  with regular cycle Check labs today.  - Ferritin - CBC  5. Low basal metabolic rate Patient will work on improving sleep time to 7 hours per night and start tracking steps with a smart watch.  6. Depression screening Chana had a positive depression screening. Depression is commonly associated with obesity and often results in emotional eating behaviors. We will monitor this closely and work on CBT to help improve the non-hunger eating patterns. Referral to Psychology may be required if no improvement is seen as she continues in our clinic.  7. Generalized obesity  8. BMI 33.0-33.9,adult Skylor is currently in the action stage of change and her goal is to continue with weight loss efforts. I recommend Lashannon begin the structured treatment plan as follows:  She has agreed to the Category 2 Plan+100 calories.   Exercise goals: All adults should avoid inactivity. Some physical activity is better than none, and adults who participate in any amount of physical activity gain some health benefits.   Behavioral modification strategies: increasing lean protein intake, increasing vegetables, increasing water intake, decreasing eating out, no skipping meals, meal planning and cooking strategies, keeping healthy foods in the home, better snacking choices, avoiding temptations, and planning for success.  She was informed of the importance of frequent follow-up visits to maximize her success with intensive lifestyle modifications for her multiple health conditions. She was informed we would discuss her lab results at her next visit unless there is a critical issue that needs to be addressed sooner. Loriel agreed to keep her next visit at the agreed upon time to discuss these results.  Objective:   Blood pressure 118/82, pulse 67, temperature 98.3 F (36.8 C), height 5\' 2"  (1.575 m), weight 185 lb (83.9 kg), SpO2 99 %. Body mass index is 33.84 kg/m.  EKG: Normal sinus rhythm, rate 68 bpm.  Indirect  Calorimeter completed today shows a VO2 of 212 and a REE of 1454.  Her calculated basal metabolic rate is 8119 thus her basal metabolic rate is worse than expected.  General: Cooperative, alert, well developed, in no acute distress. HEENT: Conjunctivae and lids unremarkable. Cardiovascular: Regular rhythm.  Lungs: Normal work of breathing. Neurologic: No focal deficits.   Lab Results  Component Value Date   CREATININE 0.69 01/25/2023   BUN 11 01/25/2023   NA 143 01/25/2023   K 4.5 01/25/2023   CL 105 01/25/2023   CO2 22 01/25/2023   Lab Results  Component Value Date   ALT 22 01/25/2023   AST 21 01/25/2023   ALKPHOS 91 01/25/2023   BILITOT 0.3 01/25/2023   Lab Results  Component Value Date   HGBA1C 5.5 01/25/2023   Lab Results  Component Value Date   INSULIN 10.4 01/25/2023   Lab Results  Component Value Date   TSH 1.290 01/25/2023   Lab Results  Component Value Date   CHOL 209 (H) 01/25/2023   HDL 50 01/25/2023   LDLCALC 141 (H) 01/25/2023   TRIG 101 01/25/2023   CHOLHDL 4.2 01/25/2023   Lab Results  Component Value Date   WBC 5.5 01/25/2023   HGB  13.0 01/25/2023   HCT 39.9 01/25/2023   MCV 90 01/25/2023   PLT 217 01/25/2023   Lab Results  Component Value Date   FERRITIN 87 01/25/2023   Attestation Statements:   Reviewed by clinician on day of visit: allergies, medications, problem list, medical history, surgical history, family history, social history, and previous encounter notes.  I, Malcolm Metro, am acting as Energy manager for Seymour Bars, DO.  I have reviewed the above documentation for accuracy and completeness, and I agree with the above. Seymour Bars DO

## 2023-02-08 ENCOUNTER — Encounter (INDEPENDENT_AMBULATORY_CARE_PROVIDER_SITE_OTHER): Payer: Self-pay | Admitting: Family Medicine

## 2023-02-08 ENCOUNTER — Ambulatory Visit (INDEPENDENT_AMBULATORY_CARE_PROVIDER_SITE_OTHER): Payer: BC Managed Care – PPO | Admitting: Family Medicine

## 2023-02-08 VITALS — BP 111/72 | HR 69 | Temp 98.2°F | Ht 62.0 in | Wt 181.0 lb

## 2023-02-08 DIAGNOSIS — Z6833 Body mass index (BMI) 33.0-33.9, adult: Secondary | ICD-10-CM

## 2023-02-08 DIAGNOSIS — E559 Vitamin D deficiency, unspecified: Secondary | ICD-10-CM | POA: Diagnosis not present

## 2023-02-08 DIAGNOSIS — E785 Hyperlipidemia, unspecified: Secondary | ICD-10-CM | POA: Diagnosis not present

## 2023-02-08 DIAGNOSIS — E669 Obesity, unspecified: Secondary | ICD-10-CM | POA: Diagnosis not present

## 2023-02-08 MED ORDER — VITAMIN D (ERGOCALCIFEROL) 1.25 MG (50000 UNIT) PO CAPS
50000.0000 [IU] | ORAL_CAPSULE | ORAL | 0 refills | Status: DC
Start: 2023-02-08 — End: 2023-02-24

## 2023-02-08 NOTE — Assessment & Plan Note (Signed)
Last vitamin D Lab Results  Component Value Date   VD25OH 18.9 (L) 01/25/2023   Reviewed lab from last visit Vitamin D is low.  Discussed how vitamin D deficiency can contribute to bone loss, poor immune function, fatigue and leptin resistance.  Begin RX vitamin D 50,000 IU weekly She also plans to start a women's MVI daily Recheck vitamin D level in 3-4 mos

## 2023-02-08 NOTE — Assessment & Plan Note (Addendum)
Lab Results  Component Value Date   CHOL 209 (H) 01/25/2023   HDL 50 01/25/2023   LDLCALC 141 (H) 01/25/2023   TRIG 101 01/25/2023   CHOLHDL 4.2 01/25/2023   She reports a + fam hx of HLD Reviewed labs with pt  Her ASCVD risk score is low and she is doing well with a low saturated fat diet Plan to recheck FLP In 6 mos  The 10-year ASCVD risk score (Arnett DK, et al., 2019) is: 0.6%   Values used to calculate the score:     Age: 42 years     Sex: Female     Is Non-Hispanic African American: No     Diabetic: No     Tobacco smoker: No     Systolic Blood Pressure: 111 mmHg     Is BP treated: No     HDL Cholesterol: 50 mg/dL     Total Cholesterol: 209 mg/dL

## 2023-02-08 NOTE — Assessment & Plan Note (Signed)
Reviewed bioimpedence changes She is feeling adequately full with meals After review of her labs (normal fasting insulin, normal A1c), she can allow 2 servings of fresh or frozen fruit daily, increase her non starchy veggies and allow one high fiber starch to dinner  Increase exercise time to 30 min 4-5 days/ wk over the next 3 weeks Continue ~1400 cal/ day which should include 85 g of protein daily

## 2023-02-08 NOTE — Progress Notes (Signed)
Office: [phone]  /  Fax: [phone]  WEIGHT SUMMARY AND BIOMETRICS  Starting Date: 01/25/23  Starting Weight: 185#   Weight Lost Since Last Visit: 4 lb   Vitals Temp: 98.2 F (36.8 C) BP: 111/72 Pulse Rate: 69 SpO2: 100 %   Body Composition  Body Fat %: 37.4 % Fat Mass (lbs): 67.6 lbs Muscle Mass (lbs): 107.6 lbs Total Body Water (lbs): 74.6 lbs Visceral Fat Rating : 8    HPI  Chief Complaint: OBESITY  Maria Park is here to discuss Maria Park progress with Maria Park obesity treatment plan. Maria Park is on the the Category 2 Plan and states Maria Park is following Maria Park eating plan approximately 80 % of the time. Maria Park states Maria Park is exercising walking for 20 minutes 2-3 times per week.   Interval History:  Since last office visit Maria Park is down 4 lb Maria Park is eating most of the food on Maria Park plan Maria Park has been busy with Maria Park kids  Maria Park will be off over the summer allowing more time for meal planning and exercise Maria Park is traveling in June and July Maria Park feels adequately full Dinner is the hardest meal to plan due to Maria Park schedule  Pharmacotherapy: none  PHYSICAL EXAM:  Blood pressure 111/72, pulse 69, temperature 98.2 F (36.8 C), height 5\' 2"  (1.575 m), weight 181 lb (82.1 kg), SpO2 100 %. Body mass index is 33.11 kg/m.  General: Maria Park is overweight, cooperative, alert, well developed, and in no acute distress. PSYCH: Has normal mood, affect and thought process.   Lungs: Normal breathing effort, no conversational dyspnea.   ASSESSMENT AND PLAN  TREATMENT PLAN FOR OBESITY:  Recommended Dietary Goals  Burdella is currently in the action stage of change. As such, Maria Park goal is to continue weight management plan. Maria Park has agreed to the Category 2 Plan.  Behavioral Intervention  We discussed the following Behavioral Modification Strategies today: increasing lean protein intake, decreasing simple carbohydrates , increasing vegetables, increasing lower glycemic fruits, increasing fiber rich foods,  avoiding skipping meals, increasing water intake, continue to work on implementation of reduced calorie nutritional plan, continue to practice mindfulness when eating, and planning for success.  Additional resources provided today: NA  Recommended Physical Activity Goals  Dannetta has been advised to work up to 150 minutes of moderate intensity aerobic activity a week and strengthening exercises 2-3 times per week for cardiovascular health, weight loss maintenance and preservation of muscle mass.   Maria Park has agreed to Start aerobic activity with a goal of 150 minutes a week at moderate intensity.   Pharmacotherapy changes for the treatment of obesity: none  ASSOCIATED CONDITIONS ADDRESSED TODAY  Hyperlipidemia, unspecified hyperlipidemia type Assessment & Plan: Lab Results  Component Value Date   CHOL 209 (H) 01/25/2023   HDL 50 01/25/2023   LDLCALC 141 (H) 01/25/2023   TRIG 101 01/25/2023   CHOLHDL 4.2 01/25/2023   Maria Park reports a + fam hx of HLD Reviewed labs with pt  Maria Park ASCVD risk score is low and Maria Park is doing well with a low saturated fat diet Plan to recheck FLP In 6 mos  The 10-year ASCVD risk score (Arnett DK, et al., 2019) is: 0.6%   Values used to calculate the score:     Age: 42 years     Sex: Female     Is Non-Hispanic African American: No     Diabetic: No     Tobacco smoker: No     Systolic Blood Pressure: 111 mmHg     Is  BP treated: No     HDL Cholesterol: 50 mg/dL     Total Cholesterol: 209 mg/dL    Vitamin D deficiency Assessment & Plan: Last vitamin D Lab Results  Component Value Date   VD25OH 18.9 (L) 01/25/2023   Reviewed lab from last visit Vitamin D is low.  Discussed how vitamin D deficiency can contribute to bone loss, poor immune function, fatigue and leptin resistance.  Begin RX vitamin D 50,000 IU weekly Maria Park also plans to start a women's MVI daily Recheck vitamin D level in 3-4 mos  Orders: -     Vitamin D (Ergocalciferol); Take 1 capsule  (50,000 Units total) by mouth every 7 (seven) days.  Dispense: 5 capsule; Refill: 0  Generalized obesity with starting BMI 34 Assessment & Plan: Reviewed bioimpedence changes Maria Park is feeling adequately full with meals After review of Maria Park labs (normal fasting insulin, normal A1c), Maria Park can allow 2 servings of fresh or frozen fruit daily, increase Maria Park non starchy veggies and allow one high fiber starch to dinner  Increase exercise time to 30 min 4-5 days/ wk over the next 3 weeks Continue ~1400 cal/ day which should include 85 g of protein daily   BMI 33.0-33.9,adult      Maria Park was informed of the importance of frequent follow up visits to maximize Maria Park success with intensive lifestyle modifications for Maria Park multiple health conditions.   ATTESTASTION STATEMENTS:  Reviewed by clinician on day of visit: allergies, medications, problem list, medical history, surgical history, family history, social history, and previous encounter notes pertinent to obesity diagnosis.   I have personally spent 30 minutes total time today in preparation, patient care, nutritional counseling and documentation for this visit, including the following: review of clinical lab tests; review of medical tests/procedures/services.      Glennis Brink, DO DABFM, DABOM [facility] [address] [place], [place] [address] [phone]

## 2023-02-09 ENCOUNTER — Ambulatory Visit (INDEPENDENT_AMBULATORY_CARE_PROVIDER_SITE_OTHER): Payer: BC Managed Care – PPO | Admitting: Family Medicine

## 2023-02-23 ENCOUNTER — Ambulatory Visit (INDEPENDENT_AMBULATORY_CARE_PROVIDER_SITE_OTHER): Payer: BC Managed Care – PPO | Admitting: Family Medicine

## 2023-02-24 ENCOUNTER — Encounter (INDEPENDENT_AMBULATORY_CARE_PROVIDER_SITE_OTHER): Payer: Self-pay | Admitting: Family Medicine

## 2023-02-24 ENCOUNTER — Ambulatory Visit (INDEPENDENT_AMBULATORY_CARE_PROVIDER_SITE_OTHER): Payer: BC Managed Care – PPO | Admitting: Family Medicine

## 2023-02-24 VITALS — BP 110/74 | HR 70 | Temp 98.6°F | Ht 62.0 in | Wt 180.0 lb

## 2023-02-24 DIAGNOSIS — Z6832 Body mass index (BMI) 32.0-32.9, adult: Secondary | ICD-10-CM | POA: Diagnosis not present

## 2023-02-24 DIAGNOSIS — E669 Obesity, unspecified: Secondary | ICD-10-CM

## 2023-02-24 DIAGNOSIS — E559 Vitamin D deficiency, unspecified: Secondary | ICD-10-CM

## 2023-02-24 DIAGNOSIS — E785 Hyperlipidemia, unspecified: Secondary | ICD-10-CM

## 2023-02-24 MED ORDER — VITAMIN D (ERGOCALCIFEROL) 1.25 MG (50000 UNIT) PO CAPS
50000.0000 [IU] | ORAL_CAPSULE | ORAL | 0 refills | Status: DC
Start: 2023-02-24 — End: 2023-03-15

## 2023-02-24 NOTE — Assessment & Plan Note (Signed)
Last vitamin D Lab Results  Component Value Date   VD25OH 18.9 (L) 01/25/2023   She is doing well on vitamin D 50,000 IU once weekly.  Energy level is improving.  She denies adverse side effects.  Continue vitamin D 50,000 IU once weekly, refilled today.  Recheck level in 3 months

## 2023-02-24 NOTE — Progress Notes (Signed)
Office: [phone]  /  Fax: [phone]  WEIGHT SUMMARY AND BIOMETRICS  Starting Date: 01/25/23  Starting Weight: 185 lb   Weight Lost Since Last Visit: 1 lb   Vitals Temp: 98.6 F (37 C) BP: 110/74 Pulse Rate: 70 SpO2: 98 %   Body Composition  Body Fat %: 37.8 % Fat Mass (lbs): 68.2 lbs Muscle Mass (lbs): 106.4 lbs Total Body Water (lbs): 76.2 lbs Visceral Fat Rating : 8    HPI  Chief Complaint: OBESITY  Maria Park is here to discuss her progress with her obesity treatment plan. She is on the the Category 2 Plan and states she is following her eating plan approximately 60 % of the time. She states she is exercising swimming 1-2 hours and walking 15-20 minutes 4 times per week.   Interval History:  Since last office visit she is is down 1 lb She lost 1.2 lb of muscle This gives her a net weight loss of 7 lb  in the past 1 month She struggles mostly with planning dinners with her kids sport schedules She had a memorial day celebration She is walking more She will be off over the summer with her kids   Pharmacotherapy: none  PHYSICAL EXAM:  Blood pressure 110/74, pulse 70, temperature 98.6 F (37 C), height 5\' 2"  (1.575 m), weight 180 lb (81.6 kg), SpO2 98 %. Body mass index is 32.92 kg/m.  General: She is overweight, cooperative, alert, well developed, and in no acute distress. PSYCH: Has normal mood, affect and thought process.   Lungs: Normal breathing effort, no conversational dyspnea.   ASSESSMENT AND PLAN  TREATMENT PLAN FOR OBESITY:  Recommended Dietary Goals  Dynisty is currently in the action stage of change. As such, her goal is to continue weight management plan. She has agreed to the Category 2 Plan.  Behavioral Intervention  We discussed the following Behavioral Modification Strategies today: increasing lean protein intake, decreasing simple carbohydrates , increasing vegetables, increasing lower glycemic fruits, increasing fiber rich  foods, avoiding skipping meals, increasing water intake, keeping healthy foods at home, continue to work on implementation of reduced calorie nutritional plan, continue to practice mindfulness when eating, and planning for success.  Additional resources provided today: NA  Recommended Physical Activity Goals  Loanna has been advised to work up to 150 minutes of moderate intensity aerobic activity a week and strengthening exercises 2-3 times per week for cardiovascular health, weight loss maintenance and preservation of muscle mass.   She has agreed to Start aerobic activity with a goal of 150 minutes a week at moderate intensity.   Pharmacotherapy changes for the treatment of obesity: none  ASSOCIATED CONDITIONS ADDRESSED TODAY  Generalized obesity with starting BMI 34 Assessment & Plan: Reviewed patient's overall progress.  She is struggling to stay on her meal plan at dinners due to schedules.  She plans to work on planning out easy dinners over the summer months.  With her kids swim schedule, she may move her heavier meal to lunch time keeping dinners light.  Will be working on avoiding meal skipping and getting an adequate protein intake given her low metabolic rate.  Keep calories around 1200/day with a protein target of about 85 g/day.  Add in more consistent exercise once knee pain improves  Consider use of an antiobesity medication if indicated.   Vitamin D deficiency Assessment & Plan: Last vitamin D Lab Results  Component Value Date   VD25OH 18.9 (L) 01/25/2023   She is doing well  on vitamin D 50,000 IU once weekly.  Energy level is improving.  She denies adverse side effects.  Continue vitamin D 50,000 IU once weekly, refilled today.  Recheck level in 3 months  Orders: -     Vitamin D (Ergocalciferol); Take 1 capsule (50,000 Units total) by mouth every 7 (seven) days.  Dispense: 5 capsule; Refill: 0  BMI 32.0-32.9,adult  Hyperlipidemia, unspecified hyperlipidemia  type Assessment & Plan: Lab Results  Component Value Date   CHOL 209 (H) 01/25/2023   HDL 50 01/25/2023   LDLCALC 141 (H) 01/25/2023   TRIG 101 01/25/2023   CHOLHDL 4.2 01/25/2023   She is working on a low saturated fat diet.  She plans added more consistent exercise.  She is currently not on lipid-lowering medication.  Recheck fasting lipid panel in the next 3 to 4 months       She was informed of the importance of frequent follow up visits to maximize her success with intensive lifestyle modifications for her multiple health conditions.   ATTESTASTION STATEMENTS:  Reviewed by clinician on day of visit: allergies, medications, problem list, medical history, surgical history, family history, social history, and previous encounter notes pertinent to obesity diagnosis.   I have personally spent 30 minutes total time today in preparation, patient care, nutritional counseling and documentation for this visit, including the following: review of clinical lab tests; review of medical tests/procedures/services.      Glennis Brink, DO DABFM, DABOM [facility] [address] [place], [place] [address] [phone]

## 2023-02-24 NOTE — Assessment & Plan Note (Signed)
Lab Results  Component Value Date   CHOL 209 (H) 01/25/2023   HDL 50 01/25/2023   LDLCALC 141 (H) 01/25/2023   TRIG 101 01/25/2023   CHOLHDL 4.2 01/25/2023   She is working on a low saturated fat diet.  She plans added more consistent exercise.  She is currently not on lipid-lowering medication.  Recheck fasting lipid panel in the next 3 to 4 months

## 2023-02-24 NOTE — Assessment & Plan Note (Signed)
Reviewed patient's overall progress.  She is struggling to stay on her meal plan at dinners due to schedules.  She plans to work on planning out easy dinners over the summer months.  With her kids swim schedule, she may move her heavier meal to lunch time keeping dinners light.  Will be working on avoiding meal skipping and getting an adequate protein intake given her low metabolic rate.  Keep calories around 1200/day with a protein target of about 85 g/day.  Add in more consistent exercise once knee pain improves  Consider use of an antiobesity medication if indicated.

## 2023-03-15 ENCOUNTER — Ambulatory Visit (INDEPENDENT_AMBULATORY_CARE_PROVIDER_SITE_OTHER): Payer: BC Managed Care – PPO | Admitting: Family Medicine

## 2023-03-15 ENCOUNTER — Encounter (INDEPENDENT_AMBULATORY_CARE_PROVIDER_SITE_OTHER): Payer: Self-pay | Admitting: Family Medicine

## 2023-03-15 VITALS — BP 107/72 | HR 79 | Temp 98.0°F | Ht 62.0 in | Wt 178.0 lb

## 2023-03-15 DIAGNOSIS — E7800 Pure hypercholesterolemia, unspecified: Secondary | ICD-10-CM | POA: Diagnosis not present

## 2023-03-15 DIAGNOSIS — Z6832 Body mass index (BMI) 32.0-32.9, adult: Secondary | ICD-10-CM | POA: Diagnosis not present

## 2023-03-15 DIAGNOSIS — E669 Obesity, unspecified: Secondary | ICD-10-CM

## 2023-03-15 DIAGNOSIS — E559 Vitamin D deficiency, unspecified: Secondary | ICD-10-CM | POA: Diagnosis not present

## 2023-03-15 MED ORDER — VITAMIN D (ERGOCALCIFEROL) 1.25 MG (50000 UNIT) PO CAPS
50000.0000 [IU] | ORAL_CAPSULE | ORAL | 0 refills | Status: AC
Start: 2023-03-15 — End: ?

## 2023-03-15 NOTE — Assessment & Plan Note (Signed)
Last vitamin D Lab Results  Component Value Date   VD25OH 18.9 (L) 01/25/2023   She is doing well on RX vitamin D 50,000 international units weekly without adverse SE  Repeat vitamin D level in 2 mos

## 2023-03-15 NOTE — Progress Notes (Signed)
Office: [phone]  /  Fax: [phone]  WEIGHT SUMMARY AND BIOMETRICS  Starting Date: 01/25/23  Starting Weight: 185 lb   Weight Lost Since Last Visit: 2 lb   Vitals Temp: 98 F (36.7 C) BP: 107/72 Pulse Rate: 79 SpO2: (!) 9 %   Body Composition  Body Fat %: 36.2 % Fat Mass (lbs): 64.6 lbs Muscle Mass (lbs): 108.2 lbs Total Body Water (lbs): 73.6 lbs Visceral Fat Rating : 8    HPI  Chief Complaint: OBESITY  Maria Park is here to discuss her progress with her obesity treatment plan. She is on the the Category 2 Plan and states she is following her eating plan approximately 50 % of the time. She states she is exercising 30-45 minutes 2-3 times per week by walking mostly and a little bit of swimming.   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 Body mass index is 32.56 kg/m.  General: She is overweight, cooperative, alert, well developed, and in no acute distress. PSYCH: Has normal mood, affect and thought process.   Lungs: Normal breathing effort, no conversational dyspnea.   ASSESSMENT AND PLAN  TREATMENT PLAN FOR OBESITY:  Recommended Dietary Goals  Maria Park is currently in the action stage of change. As such, her goal is to continue weight management plan. She has agreed to the Category 2 Plan.  Behavioral Intervention  We discussed the following Behavioral Modification Strategies today: increasing lean protein intake, decreasing simple carbohydrates ,  increasing vegetables, increasing lower glycemic fruits, increasing water intake, work on meal planning and preparation, keeping healthy foods at home, continue to practice mindfulness when eating, and planning for success.  Additional resources provided today: NA  Recommended Physical Activity Goals  Maria Park has been advised to work up to 150 minutes of moderate intensity aerobic activity a week and strengthening exercises 2-3 times per week for cardiovascular health, weight loss maintenance and preservation of muscle mass.   She has agreed to Start aerobic activity with a goal of 150 minutes a week at moderate intensity.   Pharmacotherapy changes for the treatment of obesity: none  ASSOCIATED CONDITIONS ADDRESSED TODAY  Vitamin D deficiency Assessment & Plan: Last vitamin D Lab Results  Component Value Date   VD25OH 18.9 (L) 01/25/2023   She is doing well on RX vitamin D 50,000 international units weekly without adverse SE  Repeat vitamin D level in 2 mos  Orders: -     Vitamin D (Ergocalciferol); Take 1 capsule (50,000 Units total) by mouth every 7 (seven) days.  Dispense: 5 capsule; Refill: 0  Generalized obesity with starting BMI 34  BMI 32.0-32.9,adult  Pure hypercholesterolemia Assessment & Plan: Lab Results  Component Value Date   CHOL 209 (H) 01/25/2023   HDL 50 01/25/2023   LDLCALC 141 (H) 01/25/2023   TRIG 101 01/25/2023   CHOLHDL 4.2 01/25/2023   She is currently not on any lipid  lowering medication but her RX meal plan is low in saturated fat. Plan to continue active plan for weight reduction, adding in more consistent exercise. Repeat FLP after 10/30.       She was informed of the importance of frequent follow up visits to maximize her success with intensive lifestyle modifications for her multiple health conditions.   ATTESTASTION STATEMENTS:  Reviewed by clinician on day of visit: allergies, medications, problem list, medical history, surgical  history, family history, social history, and previous encounter notes pertinent to obesity diagnosis.   I have personally spent 30 minutes total time today in preparation, patient care, nutritional counseling and documentation for this visit, including the following: review of clinical lab tests; review of medical tests/procedures/services.      Maria Brink, DO DABFM, DABOM [facility] [address] [place], [place] [address] [phone]

## 2023-03-15 NOTE — Assessment & Plan Note (Signed)
Lab Results  Component Value Date   CHOL 209 (H) 01/25/2023   HDL 50 01/25/2023   LDLCALC 141 (H) 01/25/2023   TRIG 101 01/25/2023   CHOLHDL 4.2 01/25/2023   She is currently not on any lipid lowering medication but her RX meal plan is low in saturated fat. Plan to continue active plan for weight reduction, adding in more consistent exercise. Repeat FLP after 10/30.

## 2023-03-23 ENCOUNTER — Encounter (INDEPENDENT_AMBULATORY_CARE_PROVIDER_SITE_OTHER): Payer: Self-pay | Admitting: *Deleted

## 2023-03-23 ENCOUNTER — Ambulatory Visit (INDEPENDENT_AMBULATORY_CARE_PROVIDER_SITE_OTHER): Payer: BC Managed Care – PPO | Admitting: Family Medicine

## 2023-04-07 ENCOUNTER — Encounter (INDEPENDENT_AMBULATORY_CARE_PROVIDER_SITE_OTHER): Payer: Self-pay | Admitting: Family Medicine

## 2023-04-07 ENCOUNTER — Ambulatory Visit (INDEPENDENT_AMBULATORY_CARE_PROVIDER_SITE_OTHER): Payer: BC Managed Care – PPO | Admitting: Family Medicine

## 2023-04-07 VITALS — BP 111/73 | HR 71 | Temp 97.8°F | Ht 62.0 in | Wt 178.0 lb

## 2023-04-07 DIAGNOSIS — E559 Vitamin D deficiency, unspecified: Secondary | ICD-10-CM

## 2023-04-07 DIAGNOSIS — E669 Obesity, unspecified: Secondary | ICD-10-CM | POA: Diagnosis not present

## 2023-04-07 DIAGNOSIS — E538 Deficiency of other specified B group vitamins: Secondary | ICD-10-CM | POA: Diagnosis not present

## 2023-04-07 DIAGNOSIS — E7800 Pure hypercholesterolemia, unspecified: Secondary | ICD-10-CM

## 2023-04-07 DIAGNOSIS — Z6832 Body mass index (BMI) 32.0-32.9, adult: Secondary | ICD-10-CM

## 2023-04-07 MED ORDER — VITAMIN D (ERGOCALCIFEROL) 1.25 MG (50000 UNIT) PO CAPS
50000.0000 [IU] | ORAL_CAPSULE | ORAL | 0 refills | Status: DC
Start: 2023-04-07 — End: 2023-07-19

## 2023-04-07 NOTE — Assessment & Plan Note (Signed)
Last vitamin D Lab Results  Component Value Date   VD25OH 18.9 (L) 01/25/2023   Taking RX vitamin D weekly Energy level is stable  Recheck vitamin D level next visit

## 2023-04-07 NOTE — Assessment & Plan Note (Signed)
Lab Results  Component Value Date   CHOL 209 (H) 01/25/2023   HDL 50 01/25/2023   LDLCALC 141 (H) 01/25/2023   TRIG 101 01/25/2023   CHOLHDL 4.2 01/25/2023   She has been working on a low saturated fat diet.  She has never used cholesterol-lowering medication.  She plans to increase her exercise frequency.  Recheck fasting lipid panel next visit

## 2023-04-07 NOTE — Progress Notes (Signed)
Office: [phone]  /  Fax: [phone]  WEIGHT SUMMARY AND BIOMETRICS  Starting Date: 01/25/23  Starting Weight: 185lb   Weight Lost Since Last Visit: 0lb   Vitals Temp: 97.8 F (36.6 C) BP: 111/73 Pulse Rate: 71 SpO2: 99 %   Body Composition  Body Fat %: 36.4 % Fat Mass (lbs): 65 lbs Muscle Mass (lbs): 107.6 lbs Total Body Water (lbs): 74.2 lbs Visceral Fat Rating : 8     HPI  Chief Complaint: OBESITY  Maria Park is here to discuss her progress with her obesity treatment plan. She is on the the Category 2 Plan and states she is following her eating plan approximately 70 % of the time. She states she is exercising 30 minutes 2-3 times per week.   Interval History:  Since last office visit she is down 0 lb She has lost 3 lb in the past 7 weeks of medically supervised weight management She has an upcoming trip to the beach She is down 0.6 pounds of muscle mass and up 0.4 pounds of body fat in the past 4 weeks She has been busy with her kids doing summer swim She feels like she is getting all the foods in on her plan She denies cravings or excess hunger  Pharmacotherapy: None  PHYSICAL EXAM:  Blood pressure 111/73, pulse 71, temperature 97.8 F (36.6 C), height 5\' 2"  (1.575 m), weight 178 lb (80.7 kg), SpO2 99%. Body mass index is 32.56 kg/m.  General: She is overweight, cooperative, alert, well developed, and in no acute distress. PSYCH: Has normal mood, affect and thought process.   Lungs: Normal breathing effort, no conversational dyspnea.   ASSESSMENT AND PLAN  TREATMENT PLAN FOR OBESITY:  Recommended Dietary Goals  Maria Park is currently in the action stage of change. As such, her goal is to continue weight management plan. She has agreed to the Category 2 Plan. -Start tracking daily caloric intake using the my fitness pal app with a goal of 1200 to 1400 cal/day.  This should include 85+ grams of protein intake daily  Behavioral Intervention  We  discussed the following Behavioral Modification Strategies today: increasing lean protein intake, decreasing simple carbohydrates , increasing vegetables, increasing lower glycemic fruits, increasing water intake, work on meal planning and preparation, work on Counselling psychologist calories using tracking application, work on managing stress, creating time for self-care and relaxation measures, continue to practice mindfulness when eating, and planning for success.  Additional resources provided today: NA  Recommended Physical Activity Goals  [name] has been advised to work up to 150 minutes of moderate intensity aerobic activity a week and strengthening exercises 2-3 times per week for cardiovascular health, weight loss maintenance and preservation of muscle mass.   She has agreed to [employer] strengthening exercises with a goal of 2-3 sessions a week  and Increase the intensity, frequency or duration of aerobic exercises    Pharmacotherapy changes for the treatment of obesity: None  ASSOCIATED CONDITIONS ADDRESSED TODAY  Vitamin D deficiency Assessment & Plan: Last vitamin D Lab Results  Component Value Date   VD25OH 18.9 (L) 01/25/2023   Taking RX vitamin D weekly Energy level is stable  Recheck vitamin D level next visit  Orders: -     Vitamin D (Ergocalciferol); Take 1 capsule (50,000 Units total) by mouth every 7 (seven) days.  Dispense: 5 capsule; Refill: 0  Generalized obesity with starting BMI 34  BMI 32.0-32.9,adult  Low serum vitamin B12 Assessment & Plan: Last vitamin B12  level 320 Has started vitamin B12 500 mcg once daily Denies paresthesias Energy has improved  Check B12 level next visit    Pure hypercholesterolemia Assessment & Plan: Lab Results  Component Value Date   CHOL 209 (H) 01/25/2023   HDL 50 01/25/2023   LDLCALC 141 (H) 01/25/2023   TRIG 101 01/25/2023   CHOLHDL 4.2 01/25/2023   She has been working on a low saturated fat diet.  She has  never used cholesterol-lowering medication.  She plans to increase her exercise frequency.  Recheck fasting lipid panel next visit       She was informed of the importance of frequent follow up visits to maximize her success with intensive lifestyle modifications for her multiple health conditions.   ATTESTASTION STATEMENTS:  Reviewed by clinician on day of visit: allergies, medications, problem list, medical history, surgical history, family history, social history, and previous encounter notes pertinent to obesity diagnosis.   I have personally spent 30 minutes total time today in preparation, patient care, nutritional counseling and documentation for this visit, including the following: review of clinical lab tests; review of medical tests/procedures/services.      Glennis Brink, DO DABFM, DABOM [facility] [address] [place], [place] [address] [phone]

## 2023-04-07 NOTE — Assessment & Plan Note (Signed)
Last vitamin B12 level 320 Has started vitamin B12 500 mcg once daily Denies paresthesias Energy has improved  Check B12 level next visit

## 2023-05-05 ENCOUNTER — Ambulatory Visit (INDEPENDENT_AMBULATORY_CARE_PROVIDER_SITE_OTHER): Payer: BC Managed Care – PPO | Admitting: Family Medicine

## 2023-05-05 NOTE — Progress Notes (Signed)
  TeleHealth Visit:  This visit was completed with telemedicine (audio/video) technology. Maria Park has verbally consented to this TeleHealth visit. The patient is located at home, the provider is located at home. The participants in this visit include the listed provider and patient. The visit was conducted today via MyChart video.  OBESITY Maria Park is here to discuss her progress with her obesity treatment plan along with follow-up of her obesity related diagnoses.   Today's visit was # 6 Starting weight: 185 lbs Starting date: 01/25/23 Weight at last in office visit: 178 lbs on 04/07/23 Total weight loss: 7 lbs at last in office visit on 04/07/23. Today's reported weight (05/09/23): none reported  Nutrition Plan: the Category 2 plan and keeping a food journal with goal of 1200-1400 calories and 85+ grams of protein daily   Current exercise:  swimming, walking  Interim History:  She recently went to [employer] with her family and then on a trip to [place] to visit family. She is trying to make good choices but not following cat 2 or journaling. She starts back to school August 19 and feels that the structure of her workday will help her resume following category 2.  She is a Runner, broadcasting/film/video. She has been focusing on drinking water and getting in adequate protein. She used to drink soda but has mostly cut this out.  Skipping meals: No Drinking adequate water: Yes Drinking sugar sweetened beverages:  none  Assessment/Plan:  1. Vitamin D Deficiency Vitamin D is not at goal of 50.  Most recent vitamin D level was low at 18.9. She is on  prescription ergocalciferol 50,000 IU weekly. Lab Results  Component Value Date   VD25OH 18.9 (L) 01/25/2023   Plan: Continue  prescription ergocalciferol 50,000 IU weekly   2. Generalized Obesity: Current BMI 32  Maria Park is currently in the action stage of change. As such, her goal is to continue with weight loss efforts.  She has agreed to the Category  2 plan.  Resume category 2 plan as soon as possible.  Exercise goals: All adults should avoid inactivity. Some physical activity is better than none, and adults who participate in any amount of physical activity gain some health benefits.  Behavioral modification strategies: increasing lean protein intake and decreasing simple carbohydrates .  Maria Park has agreed to follow-up with our clinic in 4 weeks.  No orders of the defined types were placed in this encounter.   There are no discontinued medications.   No orders of the defined types were placed in this encounter.     Objective:   VITALS: Per patient if applicable, see vitals. GENERAL: Alert and in no acute distress. CARDIOPULMONARY: No increased WOB. Speaking in clear sentences.  PSYCH: Pleasant and cooperative. Speech normal rate and rhythm. Affect is appropriate. Insight and judgement are appropriate. Attention is focused, linear, and appropriate.  NEURO: Oriented as arrived to appointment on time with no prompting.   Attestation Statements:   Reviewed by clinician on day of visit: allergies, medications, problem list, medical history, surgical history, family history, social history, and previous encounter notes.   This was prepared with the assistance of Engineer, civil (consulting).  Occasional wrong-word or sound-a-like substitutions may have occurred due to the inherent limitations of voice recognition software.

## 2023-05-09 ENCOUNTER — Encounter (INDEPENDENT_AMBULATORY_CARE_PROVIDER_SITE_OTHER): Payer: Self-pay | Admitting: Family Medicine

## 2023-05-09 ENCOUNTER — Telehealth (INDEPENDENT_AMBULATORY_CARE_PROVIDER_SITE_OTHER): Payer: BC Managed Care – PPO | Admitting: Family Medicine

## 2023-05-09 DIAGNOSIS — Z6832 Body mass index (BMI) 32.0-32.9, adult: Secondary | ICD-10-CM

## 2023-05-09 DIAGNOSIS — E669 Obesity, unspecified: Secondary | ICD-10-CM

## 2023-05-09 DIAGNOSIS — E559 Vitamin D deficiency, unspecified: Secondary | ICD-10-CM | POA: Diagnosis not present

## 2023-06-06 ENCOUNTER — Ambulatory Visit (INDEPENDENT_AMBULATORY_CARE_PROVIDER_SITE_OTHER): Payer: BC Managed Care – PPO | Admitting: Internal Medicine

## 2023-06-06 ENCOUNTER — Encounter (INDEPENDENT_AMBULATORY_CARE_PROVIDER_SITE_OTHER): Payer: Self-pay | Admitting: Internal Medicine

## 2023-06-06 VITALS — BP 104/72 | HR 74 | Temp 98.2°F | Ht 62.0 in | Wt 182.0 lb

## 2023-06-06 DIAGNOSIS — E669 Obesity, unspecified: Secondary | ICD-10-CM

## 2023-06-06 DIAGNOSIS — Z6833 Body mass index (BMI) 33.0-33.9, adult: Secondary | ICD-10-CM

## 2023-06-06 DIAGNOSIS — E7800 Pure hypercholesterolemia, unspecified: Secondary | ICD-10-CM | POA: Diagnosis not present

## 2023-06-06 DIAGNOSIS — F439 Reaction to severe stress, unspecified: Secondary | ICD-10-CM | POA: Diagnosis not present

## 2023-06-06 DIAGNOSIS — G479 Sleep disorder, unspecified: Secondary | ICD-10-CM | POA: Insufficient documentation

## 2023-06-06 NOTE — Assessment & Plan Note (Signed)
Patient sleeps about 4 to 5 hours and describes his sleep as interrupted and not restorative.  May be related to high levels of stress.  She needs to be screened for GAD.  I provided her with information on relaxation techniques.  I also recommend that she go to the National sleep foundation website to obtain more information about sleep hygiene.  We will discuss further at future office visits.

## 2023-06-06 NOTE — Assessment & Plan Note (Signed)
Patient is new to me, I cannot find indirect calorimetry results.  Based on calculated BMR her calorie goal is 1200 cal.  We discussed tracking and journaling also using a meal replacement for 1 meal per day as needed for convenience and to generate a deficit.  We also discussed the importance of meal prepping.  Patient also counseled on the effects of stress and inadequate sleep on weight.  We also discussed the importance of self-care and delegating some of her household responsibilities to create opportunities for this.  She is interested in antiobesity medication and we will discuss further at the next office visit.

## 2023-06-06 NOTE — Assessment & Plan Note (Signed)
Patient has moderate to high levels of stress this may be affecting her sleep but may also affect weight and increase orixegenic signaling.  I provided her with information's on relaxation.  We also discussed the importance of regular physical activity.  She will look at ways to begin exercising.

## 2023-06-06 NOTE — Assessment & Plan Note (Signed)
LDL is not at goal. Elevated LDL may be secondary to nutrition, genetics and spillover effect from excess adiposity. Recommended LDL goal is <70 to reduce the risk of fatty streaks and the progression to obstructive ASCVD in the future.   Her 10 year risk is: The 10-year ASCVD risk score (Arnett DK, et al., 2019) is: 0.5%  Lab Results  Component Value Date   CHOL 209 (H) 01/25/2023   HDL 50 01/25/2023   LDLCALC 141 (H) 01/25/2023   TRIG 101 01/25/2023   CHOLHDL 4.2 01/25/2023    Continue weight loss therapy, losing 10% or more of body weight may improve condition. Also advised to reduce saturated fats in diet to less than 10% of daily calories.

## 2023-06-06 NOTE — Progress Notes (Signed)
Office: [phone]  /  Fax: [phone]  WEIGHT SUMMARY AND BIOMETRICS  Vitals Temp: 98.2 F (36.8 C) BP: 104/72 Pulse Rate: 74 SpO2: 98 %   Anthropometric Measurements Height: 5\' 2"  (1.575 m) Weight: 182 lb (82.6 kg) BMI (Calculated): 33.28 Weight at Last Visit: 178 lb Weight Lost Since Last Visit: 0 lb Weight Gained Since Last Visit: 4lb Starting Weight: 185 lb Total Weight Loss (lbs): 3 lb (1.361 kg) Peak Weight: 190 lb   Body Composition  Body Fat %: 38.6 % Fat Mass (lbs): 70.4 lbs Muscle Mass (lbs): 106.6 lbs Total Body Water (lbs): 76.6 lbs Visceral Fat Rating : 8    No data recorded Today's Visit #: 6  Starting Date: 01/25/23   HPI  Chief Complaint: OBESITY  Maria Park is here to discuss her progress with her obesity treatment plan. She is on the the Category 2 Plan and states she is following her eating plan approximately 50 % of the time. She states she is exercising 20 minutes 3 times per week.  Interval History:  This is my first encounter with Mrs. Maria Park.  She is a pleasant 42 year old female who is affected by obesity and has Park.  Since last office visit she has gained 4 lbs. She reports fair adherence to reduced calorie nutritional plan.  Has problems with dinnertime and competing family priorities She has been working on not skipping meals, increasing protein intake at every meal, drinking more water, avoiding and / or reducing liquid calories, working on gradual implementation of reduced calorie nutrition plan, thinking of starting to exercise, and working on meal prepping  Orexigenic Control: Reports problems with appetite and hunger signals.  Denies problems with satiety and satiation.  Denies problems with eating patterns and portion control.  Denies abnormal cravings. Denies feeling deprived or restricted.   Barriers identified: lack of time for self-care, having difficulty with meal prep and planning, low volume of  physical acitivity, multiple competing priorities, work schedule, inadequate sleep, and moderate to high levels of stress.   Pharmacotherapy for weight loss: She is currently taking no anti-obesity medication.    ASSESSMENT AND PLAN  TREATMENT PLAN FOR OBESITY:  Recommended Dietary Goals  Maria Park is currently in the action stage of change. As such, her goal is to continue weight management plan. She has agreed to: continue current plan  Behavioral Intervention  We discussed the following Behavioral Modification Strategies today: increasing lean protein intake, decreasing simple carbohydrates , increasing vegetables, increasing lower glycemic fruits, increasing fiber rich foods, avoiding skipping meals, increasing water intake, work on meal planning and preparation, keeping healthy foods at home, work on managing stress, creating time for self-care and relaxation measures, and planning for success.  Additional resources provided today: Handout on reducing stress and sleep hygiene and Handout on popular protein drinks   Recommended Physical Activity Goals  Maria Park has been advised to work up to 150 minutes of moderate intensity aerobic activity a week and strengthening exercises 2-3 times per week for cardiovascular health, weight loss maintenance and preservation of muscle mass.   She has agreed to :  Think about ways to increase daily physical activity and overcoming barriers to exercise and Increase physical activity in their day and reduce sedentary time (increase NEAT).  Pharmacotherapy We discussed various medication options to help Maria Park with her weight loss efforts and we both agreed to :  Patient would like to explore antiobesity medications at the next office visit  ASSOCIATED CONDITIONS ADDRESSED TODAY  Generalized obesity  with starting BMI 34 Assessment & Plan: Patient is new to me, I cannot find indirect calorimetry results.  Based on calculated BMR her calorie goal is 1200 cal.   We discussed tracking and journaling also using a meal replacement for 1 meal per day as needed for convenience and to generate a deficit.  We also discussed the importance of meal prepping.  Patient also counseled on the effects of stress and inadequate sleep on weight.  We also discussed the importance of self-care and delegating some of her household responsibilities to create opportunities for this.  She is interested in antiobesity medication and we will discuss further at the next office visit.   Maria Park Assessment & Plan: LDL is not at goal. Elevated LDL may be secondary to nutrition, genetics and spillover effect from excess adiposity. Recommended LDL goal is <70 to reduce the risk of fatty streaks and the progression to obstructive ASCVD in the future.   Her 10 year risk is: The 10-year ASCVD risk score (Arnett DK, et al., 2019) is: 0.5%  Lab Results  Component Value Date   CHOL 209 (H) 01/25/2023   HDL 50 01/25/2023   LDLCALC 141 (H) 01/25/2023   TRIG 101 01/25/2023   CHOLHDL 4.2 01/25/2023    Continue weight loss therapy, losing 10% or more of body weight may improve condition. Also advised to reduce saturated fats in diet to less than 10% of daily calories.        Sleep difficulties Assessment & Plan: Patient sleeps about 4 to 5 hours and describes his sleep as interrupted and not restorative.  May be related to high levels of stress.  She needs to be screened for GAD.  I provided her with information on relaxation techniques.  I also recommend that she go to the National sleep foundation website to obtain more information about sleep hygiene.  We will discuss further at future office visits.   Stress Assessment & Plan: Patient has moderate to high levels of stress this may be affecting her sleep but may also affect weight and increase Maria Park signaling.  I provided her with information's on relaxation.  We also discussed the importance of regular  physical activity.  She will look at ways to begin exercising.     PHYSICAL EXAM:  Blood pressure 104/72, pulse 74, temperature 98.2 F (36.8 C), height 5\' 2"  (1.575 m), weight 182 lb (82.6 kg), SpO2 98%. Body mass index is 33.29 kg/m.  General: She is overweight, cooperative, alert, well developed, and in no acute distress. PSYCH: Has normal mood, affect and thought process.   HEENT: EOMI, sclerae are anicteric. Lungs: Normal breathing effort, no conversational dyspnea. Extremities: No edema.  Neurologic: No gross sensory or motor deficits. No tremors or fasciculations noted.    DIAGNOSTIC DATA REVIEWED:  BMET    Component Value Date/Time   NA 143 01/25/2023 0932   K 4.5 01/25/2023 0932   CL 105 01/25/2023 0932   CO2 22 01/25/2023 0932   GLUCOSE 76 01/25/2023 0932   BUN 11 01/25/2023 0932   CREATININE 0.69 01/25/2023 0932   CALCIUM 9.1 01/25/2023 0932   Lab Results  Component Value Date   HGBA1C 5.5 01/25/2023   Lab Results  Component Value Date   INSULIN 10.4 01/25/2023   Lab Results  Component Value Date   TSH 1.290 01/25/2023   CBC    Component Value Date/Time   WBC 5.5 01/25/2023 0932   WBC 13.5 (H) 04/27/2016 0515   RBC  4.46 01/25/2023 0932   RBC 3.53 (L) 04/27/2016 0515   HGB 13.0 01/25/2023 0932   HCT 39.9 01/25/2023 0932   PLT 217 01/25/2023 0932   MCV 90 01/25/2023 0932   MCH 29.1 01/25/2023 0932   MCH 28.9 04/27/2016 0515   MCHC 32.6 01/25/2023 0932   MCHC 34.3 04/27/2016 0515   RDW 12.6 01/25/2023 0932   Iron Studies    Component Value Date/Time   FERRITIN 87 01/25/2023 0932   Lipid Panel     Component Value Date/Time   CHOL 209 (H) 01/25/2023 0932   TRIG 101 01/25/2023 0932   HDL 50 01/25/2023 0932   CHOLHDL 4.2 01/25/2023 0932   LDLCALC 141 (H) 01/25/2023 0932   Hepatic Function Panel     Component Value Date/Time   PROT 6.7 01/25/2023 0932   ALBUMIN 4.4 01/25/2023 0932   AST 21 01/25/2023 0932   ALT 22 01/25/2023 0932    ALKPHOS 91 01/25/2023 0932   BILITOT 0.3 01/25/2023 0932      Component Value Date/Time   TSH 1.290 01/25/2023 0932   Nutritional Lab Results  Component Value Date   VD25OH 18.9 (L) 01/25/2023     Return in about 3 weeks (around 06/27/2023) for For Weight Mangement with Dr. Rikki Spearing.Marland Kitchen She was informed of the importance of frequent follow up visits to maximize her success with intensive lifestyle modifications for her multiple health conditions.   ATTESTASTION STATEMENTS:  Reviewed by clinician on day of visit: allergies, medications, problem list, medical history, surgical history, family history, social history, and previous encounter notes.   I have spent 40 minutes in the care of the patient today including: preparing to see patient (e.g. review and interpretation of tests, old notes ), obtaining and/or reviewing separately obtained history, performing a medically appropriate examination or evaluation, counseling and educating the patient, documenting clinical information in the electronic or other health care record, and independently interpreting results and communicating results to the patient, family, or caregiver   Worthy Rancher, MD

## 2023-07-12 ENCOUNTER — Ambulatory Visit (INDEPENDENT_AMBULATORY_CARE_PROVIDER_SITE_OTHER): Payer: BC Managed Care – PPO | Admitting: Internal Medicine

## 2023-07-13 ENCOUNTER — Ambulatory Visit (INDEPENDENT_AMBULATORY_CARE_PROVIDER_SITE_OTHER): Payer: BC Managed Care – PPO | Admitting: Internal Medicine

## 2023-07-18 ENCOUNTER — Ambulatory Visit (INDEPENDENT_AMBULATORY_CARE_PROVIDER_SITE_OTHER): Payer: BC Managed Care – PPO | Admitting: Internal Medicine

## 2023-07-18 ENCOUNTER — Encounter (INDEPENDENT_AMBULATORY_CARE_PROVIDER_SITE_OTHER): Payer: Self-pay | Admitting: Internal Medicine

## 2023-07-18 VITALS — BP 126/77 | HR 71 | Temp 98.2°F | Ht 62.0 in | Wt 177.0 lb

## 2023-07-18 DIAGNOSIS — Z6832 Body mass index (BMI) 32.0-32.9, adult: Secondary | ICD-10-CM

## 2023-07-18 DIAGNOSIS — E559 Vitamin D deficiency, unspecified: Secondary | ICD-10-CM

## 2023-07-18 DIAGNOSIS — G479 Sleep disorder, unspecified: Secondary | ICD-10-CM | POA: Diagnosis not present

## 2023-07-18 DIAGNOSIS — E669 Obesity, unspecified: Secondary | ICD-10-CM

## 2023-07-18 DIAGNOSIS — R638 Other symptoms and signs concerning food and fluid intake: Secondary | ICD-10-CM | POA: Insufficient documentation

## 2023-07-18 MED ORDER — TOPIRAMATE 25 MG PO TABS
25.0000 mg | ORAL_TABLET | Freq: Every evening | ORAL | 0 refills | Status: DC
Start: 2023-07-18 — End: 2023-08-15

## 2023-07-18 NOTE — Progress Notes (Signed)
Office: [phone]  /  Fax: [phone]  WEIGHT SUMMARY AND BIOMETRICS  Vitals Temp: 98.2 F (36.8 C) BP: 126/77 Pulse Rate: 71 SpO2: 97 %   Anthropometric Measurements Height: 5\' 2"  (1.575 m) Weight: 177 lb (80.3 kg) BMI (Calculated): 32.37 Weight at Last Visit: 178 lb Weight Lost Since Last Visit: 5 lb Weight Gained Since Last Visit: 0 lb Starting Weight: 185 lb Total Weight Loss (lbs): 8 lb (3.629 kg) Peak Weight: 190 lb   Body Composition  Body Fat %: 36.2 % Fat Mass (lbs): 64.2 lbs Muscle Mass (lbs): 107.4 lbs Total Body Water (lbs): 72.6 lbs Visceral Fat Rating : 8    No data recorded Today's Visit #: 7  Starting Date: 01/25/23   HPI  Chief Complaint: OBESITY  Maria Park is here to discuss her progress with her obesity treatment plan. She is on the the Category 2 Plan and states she is following her eating plan approximately 50 % of the time. She states she is exercising 20 minutes 7 times per week.  Interval History:  Since last office visit she has lost 5 pounds. She reports fair adherence to reduced calorie nutritional plan. She has been working on not skipping meals, increasing protein intake at every meal, drinking more water, making healthier choices, begun to exercise, reducing portion sizes, and incorporating more whole foods  Orexigenic Control: Reports problems with appetite and hunger signals.  Denies problems with satiety and satiation.  Denies problems with eating patterns and portion control.  Denies abnormal cravings. Denies feeling deprived or restricted.   Barriers identified: cost of medication and strong hunger signals and impaired satiety / inhibitory control.   Pharmacotherapy for weight loss: She is currently taking no anti-obesity medication.    ASSESSMENT AND PLAN  TREATMENT PLAN FOR OBESITY:  Recommended Dietary Goals  Maria Park is currently in the action stage of change. As such, her goal is to continue weight management  plan. She has agreed to: continue current plan  Behavioral Intervention  We discussed the following Behavioral Modification Strategies today: continue to work on maintaining a reduced calorie state, getting the recommended amount of protein, incorporating whole foods, making healthy choices, staying well hydrated and practicing mindfulness when eating..  Additional resources provided today: None  Recommended Physical Activity Goals  Maria Park has been advised to work up to 150 minutes of moderate intensity aerobic activity a week and strengthening exercises 2-3 times per week for cardiovascular health, weight loss maintenance and preservation of muscle mass.   She has agreed to :  Continue current level of physical activity   Pharmacotherapy We discussed various medication options to help Maria Park with her weight loss efforts and we both agreed to : start anti-obesity medication.  In addition to reduced calorie nutrition plan (RCNP), behavioral strategies and physical activity, Maria Park would benefit from pharmacotherapy to assist with hunger signals, satiety and cravings. This will reduce obesity-related health risks by inducing weight loss, and help reduce food consumption and adherence to [facility]) . It may also improve QOL by improving self-confidence and reduce the  setbacks associated with metabolic adaptations.  After discussion of treatment options, mechanisms of action, benefits, side effects, contraindications and shared decision making she is agreeable to starting topiramate 25 mg in the evening.  We also reviewed medication teratogenicity and the importance of maintaining adequate birth control.  She has an IUD.  We also discussed off label use of topiramate as monotherapy.  ASSOCIATED CONDITIONS ADDRESSED TODAY  Generalized obesity Assessment & Plan:  See obesity treatment plan  Orders: -     Topiramate; Take 1 tablet (25 mg total) by mouth every evening.  Dispense: 30 tablet; Refill:  0  Vitamin D deficiency Assessment & Plan: She has completed 4 months of vitamin D supplementation.  We will check levels today and transition to over-the-counter supplementation if levels are adequate.  Orders: -     VITAMIN D 25 Hydroxy (Vit-D Deficiency, Fractures)  Sleep difficulties Assessment & Plan: Improving.  She has been working on sleep hygiene and is avoiding eating meals before going to bed.  She acknowledges the importance of getting regular sleep as part of her weight loss plan.   Abnormal food appetite Assessment & Plan: She has increased orexigenic signaling, impaired satiety and inhibitory control. This is secondary to an abnormal energy regulation system and pathological neurohormonal pathways characteristic of excess adiposity.  In addition to nutritional and behavioral strategies she benefits from pharmacotherapy.   She does not have coverage for GLP-1 and it is cost prohibitive.  After discussion of benefits and side effect she will be started on topiramate 25 mg in the evening.  Next will be to add phentermine in the morning depending on clinical response.     PHYSICAL EXAM:  Blood pressure 126/77, pulse 71, temperature 98.2 F (36.8 C), height 5\' 2"  (1.575 m), weight 177 lb (80.3 kg), SpO2 97%. Body mass index is 32.37 kg/m.  General: She is overweight, cooperative, alert, well developed, and in no acute distress. PSYCH: Has normal mood, affect and thought process.   HEENT: EOMI, sclerae are anicteric. Lungs: Normal breathing effort, no conversational dyspnea. Extremities: No edema.  Neurologic: No gross sensory or motor deficits. No tremors or fasciculations noted.    DIAGNOSTIC DATA REVIEWED:  BMET    Component Value Date/Time   NA 143 01/25/2023 0932   K 4.5 01/25/2023 0932   CL 105 01/25/2023 0932   CO2 22 01/25/2023 0932   GLUCOSE 76 01/25/2023 0932   BUN 11 01/25/2023 0932   CREATININE 0.69 01/25/2023 0932   CALCIUM 9.1 01/25/2023 0932    Lab Results  Component Value Date   HGBA1C 5.5 01/25/2023   Lab Results  Component Value Date   INSULIN 10.4 01/25/2023   Lab Results  Component Value Date   TSH 1.290 01/25/2023   CBC    Component Value Date/Time   WBC 5.5 01/25/2023 0932   WBC 13.5 (H) 04/27/2016 0515   RBC 4.46 01/25/2023 0932   RBC 3.53 (L) 04/27/2016 0515   HGB 13.0 01/25/2023 0932   HCT 39.9 01/25/2023 0932   PLT 217 01/25/2023 0932   MCV 90 01/25/2023 0932   MCH 29.1 01/25/2023 0932   MCH 28.9 04/27/2016 0515   MCHC 32.6 01/25/2023 0932   MCHC 34.3 04/27/2016 0515   RDW 12.6 01/25/2023 0932   Iron Studies    Component Value Date/Time   FERRITIN 87 01/25/2023 0932   Lipid Panel     Component Value Date/Time   CHOL 209 (H) 01/25/2023 0932   TRIG 101 01/25/2023 0932   HDL 50 01/25/2023 0932   CHOLHDL 4.2 01/25/2023 0932   LDLCALC 141 (H) 01/25/2023 0932   Hepatic Function Panel     Component Value Date/Time   PROT 6.7 01/25/2023 0932   ALBUMIN 4.4 01/25/2023 0932   AST 21 01/25/2023 0932   ALT 22 01/25/2023 0932   ALKPHOS 91 01/25/2023 0932   BILITOT 0.3 01/25/2023 0932      Component Value  Date/Time   TSH 1.290 01/25/2023 0932   Nutritional Lab Results  Component Value Date   VD25OH 18.9 (L) 01/25/2023     Return in about 4 weeks (around 08/15/2023) for For Weight Mangement with Dr. Rikki Spearing.Marland Kitchen She was informed of the importance of frequent follow up visits to maximize her success with intensive lifestyle modifications for her multiple health conditions.   ATTESTASTION STATEMENTS:  Reviewed by clinician on day of visit: allergies, medications, problem list, medical history, surgical history, family history, social history, and previous encounter notes.     Worthy Rancher, MD

## 2023-07-18 NOTE — Assessment & Plan Note (Signed)
Improving.  She has been working on sleep hygiene and is avoiding eating meals before going to bed.  She acknowledges the importance of getting regular sleep as part of her weight loss plan.

## 2023-07-18 NOTE — Assessment & Plan Note (Signed)
 See obesity treatment plan

## 2023-07-18 NOTE — Assessment & Plan Note (Signed)
She has completed 4 months of vitamin D supplementation.  We will check levels today and transition to over-the-counter supplementation if levels are adequate.

## 2023-07-18 NOTE — Assessment & Plan Note (Signed)
She has increased orexigenic signaling, impaired satiety and inhibitory control. This is secondary to an abnormal energy regulation system and pathological neurohormonal pathways characteristic of excess adiposity.  In addition to nutritional and behavioral strategies she benefits from pharmacotherapy.   She does not have coverage for GLP-1 and it is cost prohibitive.  After discussion of benefits and side effect she will be started on topiramate 25 mg in the evening.  Next will be to add phentermine in the morning depending on clinical response.

## 2023-07-19 ENCOUNTER — Other Ambulatory Visit (INDEPENDENT_AMBULATORY_CARE_PROVIDER_SITE_OTHER): Payer: Self-pay | Admitting: Internal Medicine

## 2023-07-19 ENCOUNTER — Telehealth (INDEPENDENT_AMBULATORY_CARE_PROVIDER_SITE_OTHER): Payer: Self-pay

## 2023-07-19 LAB — VITAMIN D 25 HYDROXY (VIT D DEFICIENCY, FRACTURES): Vit D, 25-Hydroxy: 47.7 ng/mL (ref 30.0–100.0)

## 2023-07-19 MED ORDER — VITAMIN D3 50 MCG (2000 UT) PO CAPS: 2000.0000 [IU] | ORAL_CAPSULE | Freq: Every day | ORAL | Status: AC

## 2023-07-19 NOTE — Telephone Encounter (Signed)
Pt advised that Vit D levels are adequate and  to d/c Rx and start otc D3 2000 units daily

## 2023-08-15 ENCOUNTER — Encounter (INDEPENDENT_AMBULATORY_CARE_PROVIDER_SITE_OTHER): Payer: Self-pay | Admitting: Internal Medicine

## 2023-08-15 ENCOUNTER — Ambulatory Visit (INDEPENDENT_AMBULATORY_CARE_PROVIDER_SITE_OTHER): Payer: BC Managed Care – PPO | Admitting: Internal Medicine

## 2023-08-15 VITALS — BP 102/69 | HR 70 | Temp 98.2°F | Ht 62.0 in | Wt 179.0 lb

## 2023-08-15 DIAGNOSIS — R638 Other symptoms and signs concerning food and fluid intake: Secondary | ICD-10-CM | POA: Diagnosis not present

## 2023-08-15 DIAGNOSIS — E669 Obesity, unspecified: Secondary | ICD-10-CM

## 2023-08-15 DIAGNOSIS — Z6832 Body mass index (BMI) 32.0-32.9, adult: Secondary | ICD-10-CM

## 2023-08-15 MED ORDER — PHENTERMINE HCL 37.5 MG PO TABS: 18.7500 mg | ORAL_TABLET | Freq: Every day | ORAL | 0 refills | Status: DC

## 2023-08-15 MED ORDER — TOPIRAMATE 25 MG PO TABS: 25.0000 mg | ORAL_TABLET | Freq: Every evening | ORAL | 0 refills | Status: DC

## 2023-08-15 NOTE — Progress Notes (Signed)
Office: [phone]  /  Fax: [phone]  Weight Summary And Biometrics  Vitals Temp: 98.2 F (36.8 C) BP: 102/69 Pulse Rate: 70 SpO2: 99 %   Anthropometric Measurements Height: 5\' 2"  (1.575 m) Weight: 179 lb (81.2 kg) BMI (Calculated): 32.73 Weight at Last Visit: 177 lb Weight Lost Since Last Visit: 0 lb Weight Gained Since Last Visit: 2 kb Starting Weight: 185 lb Total Weight Loss (lbs): 6 lb (2.722 kg) Peak Weight: 190 lb   Body Composition  Body Fat %: 36.9 % Fat Mass (lbs): 66.2 lbs Muscle Mass (lbs): 107.6 lbs Total Body Water (lbs): 76.2 lbs Visceral Fat Rating : 8    No data recorded Today's Visit #: 8  Starting Date: 01/25/23   Subjective   Chief Complaint: Obesity  Shaunita is here to discuss her progress with her obesity treatment plan. She is on the the Category 2 Plan and states she is following her eating plan approximately 70 % of the time. She states she is exercising 20 minutes 4 times per week.  Interval History:   Discussed the use of AI scribe software for clinical note transcription with the patient, who gave verbal consent to proceed.  History of Present Illness   The patient, affected by obesity, presents for a follow-up visit on weight management. She was previously started on topiramate 25mg  in the evening. The patient reports that the medication has altered the taste of soda, leading to complete cessation of soda consumption. She also notes improved sleep quality, although she still wakes up at least once a night due to her dog's whining.  The patient has noticed a decrease in hunger and believes her appetite has improved. Despite this, she reports a recent weight gain of 2 pounds, which she finds surprising as she has been consuming more vegetables and has started doing yoga. She acknowledges that she may not be getting enough protein in her diet, which could be contributing to her weight gain.  The patient has been making efforts to  plan meals ahead of time, incorporating more vegetables and trying to make healthier choices. She has been successful in following a meal plan for breakfast and lunch, but finds dinner to be more challenging due to family preferences and scheduling. She has been using a crockpot for meal preparation and trying to incorporate more vegetables into her meals.  The patient has been avoiding fast food and trying to eat out less frequently due to cost and health considerations. She has been packing fruits and vegetables for her children's lunches and trying to increase her own fruit and vegetable intake. She acknowledges that she needs to pay more attention to portion sizes and the balance of macronutrients in her meals.  The patient has been consistent in taking the topiramate medication, although she missed a few doses due to falling asleep before taking the medication. She is open to starting phentermine during the day to further assist with appetite suppression and weight management.       Barriers identified: lack of time for self-care and multiple competing priorities.   Pharmacotherapy for weight loss: She is currently taking Topiramate (off label use, single agent) with adequate clinical response  and without side effects..   Assessment and Plan   Treatment Plan For Obesity:  Recommended Dietary Goals  Clarinda is currently in the action stage of change. As such, her goal is to continue weight management plan. She has agreed to: continue current plan  Behavioral Intervention  We discussed  the following Behavioral Modification Strategies today: continue to work on maintaining a reduced calorie state, getting the recommended amount of protein, incorporating whole foods, making healthy choices, staying well hydrated and practicing mindfulness when eating..  Additional resources provided today: None  Recommended Physical Activity Goals  Anelisa has been advised to work up to 150 minutes of  moderate intensity aerobic activity a week and strengthening exercises 2-3 times per week for cardiovascular health, weight loss maintenance and preservation of muscle mass.   She has agreed to :  Think about enjoyable ways to increase daily physical activity and overcoming barriers to exercise and Increase physical activity in their day and reduce sedentary time (increase NEAT).  Pharmacotherapy  We discussed various medication options to help Kynzleigh with her weight loss efforts and we both agreed to :  She will continue on topiramate 25 mg in the evening.  After discussion of benefits and side effects as well as off label use she will be started on phentermine 18.75 mg 30 minutes before breakfast.  Patient signed controlled substance agreement.  We also reviewed state registry for controlled substances  Associated Conditions Addressed Today  Assessment and Plan    Obesity /abnormal food appetite Uriah, who is on a follow-up for weight management, has seen minor weight gain but reports improved sleep quality, reduced appetite, and increased muscle mass since starting topiramate 25 mg in the evening. Her body fat percentage has decreased from 39% to 36%. Despite incorporating more vegetables into her diet and starting yoga, Dawsyn struggles with meal planning, especially dinners, and may not be consuming enough protein. We discussed initiating phentermine for further appetite suppression, detailing its potential side effects and the necessity of taking it before breakfast on an empty stomach, aiming for a 5% reduction in body weight over six months. We will continue topiramate 25 mg in the evening, start phentermine 30 minutes before breakfast, monitor for side effects such as increased blood pressure, heart rate, anxiety, dry mouth, and constipation, and encourage balanced nutrition with adequate protein intake, suggesting protein smoothies as meal replacements. Follow-up is scheduled in one  month.  General Health Maintenance Roshell is enhancing her diet by adding more vegetables and planning meals, and has begun practicing yoga with her family. We advised continuing these habits, ensuring protein intake constitutes 30% of daily calories, considering prepackaged healthy meal services, using protein chips as healthier snack alternatives, and maintaining yoga and other physical activities. Follow-up in one month.             Objective   Physical Exam:  Blood pressure 102/69, pulse 70, temperature 98.2 F (36.8 C), height 5\' 2"  (1.575 m), weight 179 lb (81.2 kg), SpO2 99%. Body mass index is 32.74 kg/m.  General: She is overweight, cooperative, alert, well developed, and in no acute distress. PSYCH: Has normal mood, affect and thought process.   HEENT: EOMI, sclerae are anicteric. Lungs: Normal breathing effort, no conversational dyspnea. Extremities: No edema.  Neurologic: No gross sensory or motor deficits. No tremors or fasciculations noted.    Diagnostic Data Reviewed:  BMET    Component Value Date/Time   NA 143 01/25/2023 0932   K 4.5 01/25/2023 0932   CL 105 01/25/2023 0932   CO2 22 01/25/2023 0932   GLUCOSE 76 01/25/2023 0932   BUN 11 01/25/2023 0932   CREATININE 0.69 01/25/2023 0932   CALCIUM 9.1 01/25/2023 0932   Lab Results  Component Value Date   HGBA1C 5.5 01/25/2023   Lab Results  Component Value Date   INSULIN 10.4 01/25/2023   Lab Results  Component Value Date   TSH 1.290 01/25/2023   CBC    Component Value Date/Time   WBC 5.5 01/25/2023 0932   WBC 13.5 (H) 04/27/2016 0515   RBC 4.46 01/25/2023 0932   RBC 3.53 (L) 04/27/2016 0515   HGB 13.0 01/25/2023 0932   HCT 39.9 01/25/2023 0932   PLT 217 01/25/2023 0932   MCV 90 01/25/2023 0932   MCH 29.1 01/25/2023 0932   MCH 28.9 04/27/2016 0515   MCHC 32.6 01/25/2023 0932   MCHC 34.3 04/27/2016 0515   RDW 12.6 01/25/2023 0932   Iron Studies    Component Value Date/Time   FERRITIN  87 01/25/2023 0932   Lipid Panel     Component Value Date/Time   CHOL 209 (H) 01/25/2023 0932   TRIG 101 01/25/2023 0932   HDL 50 01/25/2023 0932   CHOLHDL 4.2 01/25/2023 0932   LDLCALC 141 (H) 01/25/2023 0932   Hepatic Function Panel     Component Value Date/Time   PROT 6.7 01/25/2023 0932   ALBUMIN 4.4 01/25/2023 0932   AST 21 01/25/2023 0932   ALT 22 01/25/2023 0932   ALKPHOS 91 01/25/2023 0932   BILITOT 0.3 01/25/2023 0932      Component Value Date/Time   TSH 1.290 01/25/2023 0932   Nutritional Lab Results  Component Value Date   VD25OH 47.7 07/18/2023   VD25OH 18.9 (L) 01/25/2023    Follow-Up   No follow-ups on file.Marland Kitchen She was informed of the importance of frequent follow up visits to maximize her success with intensive lifestyle modifications for her multiple health conditions.  Attestation Statement   Reviewed by clinician on day of visit: allergies, medications, problem list, medical history, surgical history, family history, social history, and previous encounter notes.     Worthy Rancher, MD

## 2023-09-12 ENCOUNTER — Encounter (INDEPENDENT_AMBULATORY_CARE_PROVIDER_SITE_OTHER): Payer: Self-pay | Admitting: Internal Medicine

## 2023-09-12 ENCOUNTER — Ambulatory Visit (INDEPENDENT_AMBULATORY_CARE_PROVIDER_SITE_OTHER): Payer: BC Managed Care – PPO | Admitting: Internal Medicine

## 2023-09-12 VITALS — BP 110/68 | HR 67 | Temp 98.2°F | Ht 62.0 in | Wt 178.0 lb

## 2023-09-12 DIAGNOSIS — R638 Other symptoms and signs concerning food and fluid intake: Secondary | ICD-10-CM | POA: Diagnosis not present

## 2023-09-12 DIAGNOSIS — G479 Sleep disorder, unspecified: Secondary | ICD-10-CM | POA: Diagnosis not present

## 2023-09-12 DIAGNOSIS — Z6832 Body mass index (BMI) 32.0-32.9, adult: Secondary | ICD-10-CM

## 2023-09-12 DIAGNOSIS — E669 Obesity, unspecified: Secondary | ICD-10-CM | POA: Diagnosis not present

## 2023-09-12 MED ORDER — LOMAIRA 8 MG PO TABS: 8.0000 mg | ORAL_TABLET | Freq: Every day | ORAL | 0 refills | Status: AC

## 2023-09-12 MED ORDER — TOPIRAMATE 25 MG PO TABS: 25.0000 mg | ORAL_TABLET | Freq: Every evening | ORAL | 0 refills | Status: AC

## 2023-09-12 NOTE — Assessment & Plan Note (Signed)
 See obesity treatment plan

## 2023-09-12 NOTE — Assessment & Plan Note (Signed)
Improving but recently worsened with the use of phentermine.  We will discontinue Adipex and try Lomaira 8 mg in the morning.  She will continue on topiramate in the evening for appetite suppression medication has also been helpful with her sleep.  No side effects reported

## 2023-09-12 NOTE — Assessment & Plan Note (Signed)
Improving.  She has increased orexigenic signaling, impaired satiety and inhibitory control. This is secondary to an abnormal energy regulation system and pathological neurohormonal pathways characteristic of excess adiposity.  In addition to nutritional and behavioral strategies she benefits from ongoing pharmacotherapy.   She does not have coverage for GLP-1 and it is cost prohibitive.  She is currently on topiramate 25 mg in the evening and phentermine but is having problems with insomnia with Adipex.  This will be switched to Lomaira 8 mg in the morning.

## 2023-09-12 NOTE — Progress Notes (Signed)
Office: [phone]  /  Fax: [phone]  Weight Summary And Biometrics  Vitals Temp: 98.2 F (36.8 C) BP: 110/68 Pulse Rate: 67 SpO2: 99 %   Anthropometric Measurements Height: 5\' 2"  (1.575 m) Weight: 178 lb (80.7 kg) BMI (Calculated): 32.55 Weight at Last Visit: 179 lb Weight Lost Since Last Visit: 1 lb Weight Gained Since Last Visit: 0 lb Starting Weight: 185 lb Total Weight Loss (lbs): 7 lb (3.175 kg) Peak Weight: 190 lb   Body Composition  Body Fat %: 38 % Fat Mass (lbs): 68 lbs Muscle Mass (lbs): 105.2 lbs Total Body Water (lbs): 76 lbs Visceral Fat Rating : 8    No data recorded Today's Visit #: 9  Starting Date: 01/25/23   Subjective   Chief Complaint: Obesity  Maria Park is here to discuss her progress with her obesity treatment plan. She is on the the Category 2 Plan and states she is following her eating plan approximately 50 % of the time. She states she is exercising 20-30 minutes 3-4 times per week.  Interval History:   Since last office visit she has lost 1 pounds. She reports fair adherence to reduced calorie nutritional plan. She has been working on reading food labels, not skipping meals, increasing protein intake at every meal, drinking more water, making healthier choices, reducing portion sizes, and incorporating more whole foods   Orexigenic Control:  Improved on AOM.   Barriers identified: multiple competing priorities and work schedule.   Pharmacotherapy for weight loss: She is currently taking Topiramate (off label use, single agent) with adequate clinical response  and without side effects. and Phentermine (longterm use, single agent)  with adequate clinical response  and experiencing the following side effects: insomnia..   Assessment and Plan   Treatment Plan For Obesity:  Recommended Dietary Goals  Maria Park is currently in the action stage of change. As such, her goal is to continue weight management plan. She has agreed to:  continue current plan  Behavioral Intervention  We discussed the following Behavioral Modification Strategies today: continue to work on maintaining a reduced calorie state, getting the recommended amount of protein, incorporating whole foods, making healthy choices, staying well hydrated and practicing mindfulness when eating..  Additional resources provided today: None  Recommended Physical Activity Goals  Maria Park has been advised to work up to 150 minutes of moderate intensity aerobic activity a week and strengthening exercises 2-3 times per week for cardiovascular health, weight loss maintenance and preservation of muscle mass.   She has agreed to :  continue to gradually increase the amount and intensity of exercise routine  Pharmacotherapy  We discussed various medication options to help Maria Park with her weight loss efforts and we both agreed to :  We will discontinue Adipex as is contributing to her insomnia and will try Lomaira 8 mg in the morning.  She will continue on topiramate 25 mg in the evening.  She has found medication helpful and also helps her with her sleep.  We may increase medication to 50 mg in the future.  Patient has an IUD and is aware about the teratogenic effects of medication.  Associated Conditions Addressed Today  Sleep difficulties Assessment & Plan: Improving but recently worsened with the use of phentermine.  We will discontinue Adipex and try Lomaira 8 mg in the morning.  She will continue on topiramate in the evening for appetite suppression medication has also been helpful with her sleep.  No side effects reported   Generalized obesity Assessment &  Plan: See obesity treatment plan  Orders: -     Topiramate; Take 1 tablet (25 mg total) by mouth every evening.  Dispense: 30 tablet; Refill: 0 -     Lomaira; Take 1 tablet (8 mg total) by mouth daily.  Dispense: 28 tablet; Refill: 0  Abnormal food appetite Assessment & Plan: Improving.  She has increased  orexigenic signaling, impaired satiety and inhibitory control. This is secondary to an abnormal energy regulation system and pathological neurohormonal pathways characteristic of excess adiposity.  In addition to nutritional and behavioral strategies she benefits from ongoing pharmacotherapy.   She does not have coverage for GLP-1 and it is cost prohibitive.  She is currently on topiramate 25 mg in the evening and phentermine but is having problems with insomnia with Adipex.  This will be switched to Lomaira 8 mg in the morning.  Orders: -     Topiramate; Take 1 tablet (25 mg total) by mouth every evening.  Dispense: 30 tablet; Refill: 0     Objective   Physical Exam:  Blood pressure 110/68, pulse 67, temperature 98.2 F (36.8 C), height 5\' 2"  (1.575 m), weight 178 lb (80.7 kg), SpO2 99%. Body mass index is 32.56 kg/m.  General: She is overweight, cooperative, alert, well developed, and in no acute distress. PSYCH: Has normal mood, affect and thought process.   HEENT: EOMI, sclerae are anicteric. Lungs: Normal breathing effort, no conversational dyspnea. Extremities: No edema.  Neurologic: No gross sensory or motor deficits. No tremors or fasciculations noted.    Diagnostic Data Reviewed:  BMET    Component Value Date/Time   NA 143 01/25/2023 0932   K 4.5 01/25/2023 0932   CL 105 01/25/2023 0932   CO2 22 01/25/2023 0932   GLUCOSE 76 01/25/2023 0932   BUN 11 01/25/2023 0932   CREATININE 0.69 01/25/2023 0932   CALCIUM 9.1 01/25/2023 0932   Lab Results  Component Value Date   HGBA1C 5.5 01/25/2023   Lab Results  Component Value Date   INSULIN 10.4 01/25/2023   Lab Results  Component Value Date   TSH 1.290 01/25/2023   CBC    Component Value Date/Time   WBC 5.5 01/25/2023 0932   WBC 13.5 (H) 04/27/2016 0515   RBC 4.46 01/25/2023 0932   RBC 3.53 (L) 04/27/2016 0515   HGB 13.0 01/25/2023 0932   HCT 39.9 01/25/2023 0932   PLT 217 01/25/2023 0932   MCV 90  01/25/2023 0932   MCH 29.1 01/25/2023 0932   MCH 28.9 04/27/2016 0515   MCHC 32.6 01/25/2023 0932   MCHC 34.3 04/27/2016 0515   RDW 12.6 01/25/2023 0932   Iron Studies    Component Value Date/Time   FERRITIN 87 01/25/2023 0932   Lipid Panel     Component Value Date/Time   CHOL 209 (H) 01/25/2023 0932   TRIG 101 01/25/2023 0932   HDL 50 01/25/2023 0932   CHOLHDL 4.2 01/25/2023 0932   LDLCALC 141 (H) 01/25/2023 0932   Hepatic Function Panel     Component Value Date/Time   PROT 6.7 01/25/2023 0932   ALBUMIN 4.4 01/25/2023 0932   AST 21 01/25/2023 0932   ALT 22 01/25/2023 0932   ALKPHOS 91 01/25/2023 0932   BILITOT 0.3 01/25/2023 0932      Component Value Date/Time   TSH 1.290 01/25/2023 0932   Nutritional Lab Results  Component Value Date   VD25OH 47.7 07/18/2023   VD25OH 18.9 (L) 01/25/2023    Follow-Up   Return  in about 4 weeks (around 10/10/2023) for For Weight Mangement with Dr. Rikki Spearing.Marland Kitchen She was informed of the importance of frequent follow up visits to maximize her success with intensive lifestyle modifications for her multiple health conditions.  Attestation Statement   Reviewed by clinician on day of visit: allergies, medications, problem list, medical history, surgical history, family history, social history, and previous encounter notes.     Worthy Rancher, MD

## 2023-10-19 ENCOUNTER — Ambulatory Visit (INDEPENDENT_AMBULATORY_CARE_PROVIDER_SITE_OTHER): Payer: Self-pay | Admitting: Internal Medicine

## 2023-11-15 ENCOUNTER — Ambulatory Visit (INDEPENDENT_AMBULATORY_CARE_PROVIDER_SITE_OTHER): Payer: Self-pay | Admitting: Internal Medicine

## 2023-12-01 ENCOUNTER — Encounter (INDEPENDENT_AMBULATORY_CARE_PROVIDER_SITE_OTHER): Payer: Self-pay
